# Patient Record
Sex: Female | Born: 1945 | Hispanic: No | Marital: Married | Smoking: Never smoker
Health system: Southern US, Community
[De-identification: ages and names within clinical notes are randomized; demographics above are authoritative.]

## PROBLEM LIST (undated history)

## (undated) DIAGNOSIS — F32A Depression, unspecified: Secondary | ICD-10-CM

## (undated) DIAGNOSIS — R112 Nausea with vomiting, unspecified: Secondary | ICD-10-CM

## (undated) DIAGNOSIS — I10 Essential (primary) hypertension: Secondary | ICD-10-CM

## (undated) DIAGNOSIS — K219 Gastro-esophageal reflux disease without esophagitis: Secondary | ICD-10-CM

## (undated) DIAGNOSIS — Z9889 Other specified postprocedural states: Secondary | ICD-10-CM

## (undated) DIAGNOSIS — E785 Hyperlipidemia, unspecified: Secondary | ICD-10-CM

## (undated) HISTORY — DX: Other specified postprocedural states: Z98.890

## (undated) HISTORY — DX: Depression, unspecified: F32.A

## (undated) HISTORY — DX: Hyperlipidemia, unspecified: E78.5

## (undated) HISTORY — DX: Essential (primary) hypertension: I10

## (undated) HISTORY — DX: Gastro-esophageal reflux disease without esophagitis: K21.9

---

## 1997-11-23 HISTORY — PX: FACELIFT, BLEPHAROPLASTY, LASER (COSMETIC): SHX4099

## 2001-11-23 DIAGNOSIS — C679 Malignant neoplasm of bladder, unspecified: Secondary | ICD-10-CM

## 2001-11-23 HISTORY — DX: Malignant neoplasm of bladder, unspecified: C67.9

## 2001-11-23 HISTORY — PX: BREAST SURGERY: SHX581

## 2003-06-08 ENCOUNTER — Ambulatory Visit: Admission: RE | Admit: 2003-06-08 | Payer: Self-pay | Source: Ambulatory Visit | Admitting: Gastroenterology

## 2006-06-11 ENCOUNTER — Ambulatory Visit: Admission: RE | Admit: 2006-06-11 | Payer: Self-pay | Source: Ambulatory Visit | Admitting: Gastroenterology

## 2007-01-23 ENCOUNTER — Emergency Department: Admit: 2007-01-23 | Payer: Self-pay | Source: Emergency Department | Admitting: Emergency Medicine

## 2010-11-23 HISTORY — PX: COSMETIC SURGERY: SHX468

## 2011-11-10 NOTE — Op Note (Signed)
MRN: [id number] DOCUMENT ID: [id number]      INTRODUCTION:      65 YEAR OLD FEMALE PATIENT PRESENTS FOR AN ELECTIVE OUTPATIENT EGD.  THE      INDICATION FOR THE PROCEDURE WAS GERD SYMPTOMS.      CONSENT:      THE BENEFITS, RISKS, AND ALTERNATIVES TO THE PROCEDURE WERE DISCUSSED AND      INFORMED CONSENT WAS OBTAINED.      PREPARATION:      EKG, PULSE, PULSE OXIMETRY AND BLOOD PRESSURE MONITORED.      MEDICATIONS:      - MAC ANESTHESIA WAS ADMINISTERED THROUGHOUT THE PROCEDURE      PROCEDURE:      THE ENDOSCOPE WAS PASSED WITHOUT DIFFICULTY UNDER DIRECT VISUALIZATION TO      THE 3RD PORTION OF THE DUODENUM.  RETROFLEXION WAS PERFORMED IN THE FUNDUS.      FINDINGS:      ESOPHAGUS: POSSIBLE BARRETT'S ESOPHAGUS WAS NOTED 2 CM ABOVE THE GE      JUNCTION.  MULTIPLE COLD BIOPSIES WERE OBTAINED FROM THE ESOPHAGUS.  THE      ESOPHAGUS WAS OTHERWISE NORMAL.      DUODENUM: THE DUODENUM APPEARED NORMAL.      STOMACH: THE STOMACH APPEARED NORMAL.      COMPLICATIONS:      THERE WERE NO COMPLICATIONS ASSOCIATED WITH THE PROCEDURE.      IMPRESSION:      1.  POSSIBLE BARRETT'S ESOPHAGUS. [530.2]. MULTIPLE COLD BIOPSIES WERE      OBTAINED FROM THE ESOPHAGUS.      2.  THE DUODENUM APPEARED NORMAL.      3.  THE STOMACH APPEARED NORMAL.      RECOMMENDATION:      - CONTINUE CURRENT MEDICATIONS.      - FOLLOW-UP WITH DR. Wardell Heath IN 2 WEEKS.      SIGNING PHYSICIAN: Marnie Fazzino

## 2012-09-03 ENCOUNTER — Ambulatory Visit (INDEPENDENT_AMBULATORY_CARE_PROVIDER_SITE_OTHER): Payer: Medicare Other | Admitting: Adult Health

## 2012-09-03 ENCOUNTER — Encounter (INDEPENDENT_AMBULATORY_CARE_PROVIDER_SITE_OTHER): Payer: Self-pay

## 2012-09-03 VITALS — BP 132/84 | HR 84 | Temp 98.2°F | Resp 18 | Ht 64.0 in | Wt 141.6 lb

## 2012-09-03 DIAGNOSIS — L0390 Cellulitis, unspecified: Secondary | ICD-10-CM

## 2012-09-03 DIAGNOSIS — L0291 Cutaneous abscess, unspecified: Secondary | ICD-10-CM

## 2012-09-03 MED ORDER — DOXYCYCLINE HYCLATE 100 MG PO TABS
100.00 mg | ORAL_TABLET | Freq: Two times a day (BID) | ORAL | Status: AC
Start: 2012-09-03 — End: 2012-09-13

## 2012-09-03 NOTE — Patient Instructions (Addendum)
Cellulitis  You have an infection of the skin known as cellulitis. This usually starts with a scrape, cut, insect bite, blister or other opening in the skin which becomes infected. This is a serious condition. It must be watched closely to be sure the infection is not spreading.  With antibiotic treatment, the size of the red area will gradually shrink in size until the skin returns to normal. This will take 7-10 days.  The red area should never increase in size once the antibiotic medicine has been started. Occasionally, an infection will be resistant to one antibiotic and another one will have to be used.  Home Care:  1) Limit the use of the affected part, since excess movement can cause the infection to spread.  2) If the infection is on your leg, walk as little as possible during the first few days of the treatment. Keep your leg elevated while sitting. This will reduce swelling.  3) Take all of the antibiotic medicine exactly as directed until it is gone. Be careful not to miss any doses, especially during the first seven days.  Follow Up  with your doctor or this facility as directed. Check the infected area daily for the warning signs listed below.  Get Prompt Medical Attention  if any of the following occur:  -- Spreading area of redness  -- Increasing swelling or pain  -- Appearance of pus or drainage  -- Fever over 100.4 F (38.0 C) oral, or over 101.4 F (38.6 C) rectal, after two days on antibiotics   2000-2013 [employer], [address], [place] [address]. All rights reserved. This information is not intended as a substitute for professional medical care. Always follow your healthcare professional's instructions.

## 2012-09-04 NOTE — Progress Notes (Signed)
Subjective:       Patient ID: Natalie Dean is a 66 y.o. female.  Chief Complaint   Patient presents with   . Cellulitis     Pt presents with an infection behind her left ear.  Pt states that the infection begin with the removal of a tick on 08/22/2012.  Pt was placed on tetracycline and neosporin when removal occured by pcp   Had plastic surgery x 1 year ago, sutures done behind the ear     Rash  This is a recurrent problem. The current episode started in the past 7 days. The affected locations include the face (behind lt ear ). The rash is characterized by pain, redness and swelling. She was exposed to nothing (though she pulled a tick from behind her ear ).       The following portions of the patient's history were reviewed and updated as appropriate: allergies, current medications, past family history, past medical history, past social history, past surgical history and problem list.    Review of Systems   Skin: Positive for rash (behind lt ear ).           Objective:    Physical Exam   Constitutional: She appears well-developed and well-nourished.   HENT:   Head: Normocephalic.   Eyes: Pupils are equal, round, and reactive to light.   Cardiovascular: Normal rate and regular rhythm.    Pulmonary/Chest: Effort normal.   Skin: Skin is warm and dry. Lesion noted.                  Assessment:       1. Cellulitis            Plan:        Medicines as prescribed .    Follow-up w/ PMD

## 2015-02-10 ENCOUNTER — Ambulatory Visit (INDEPENDENT_AMBULATORY_CARE_PROVIDER_SITE_OTHER): Payer: Medicare Other | Admitting: Adult Health

## 2015-02-10 ENCOUNTER — Encounter (INDEPENDENT_AMBULATORY_CARE_PROVIDER_SITE_OTHER): Payer: Self-pay

## 2015-02-10 VITALS — BP 135/76 | HR 88 | Temp 99.9°F | Resp 16 | Ht 64.0 in | Wt 141.0 lb

## 2015-02-10 DIAGNOSIS — Z029 Encounter for administrative examinations, unspecified: Secondary | ICD-10-CM

## 2015-02-10 DIAGNOSIS — R6889 Other general symptoms and signs: Secondary | ICD-10-CM

## 2015-02-10 DIAGNOSIS — J101 Influenza due to other identified influenza virus with other respiratory manifestations: Secondary | ICD-10-CM

## 2015-02-10 LAB — POCT INFLUENZA A/B
POCT Rapid Influenza A AG: POSITIVE — AB
POCT Rapid Influenza B AG: NEGATIVE

## 2015-02-10 MED ORDER — ALBUTEROL SULFATE HFA 108 (90 BASE) MCG/ACT IN AERS
2.0000 | INHALATION_SPRAY | Freq: Four times a day (QID) | RESPIRATORY_TRACT | Status: AC | PRN
Start: 2015-02-10 — End: 2016-02-10

## 2015-02-10 MED ORDER — OSELTAMIVIR PHOSPHATE 75 MG PO CAPS
75.0000 mg | ORAL_CAPSULE | Freq: Two times a day (BID) | ORAL | Status: AC
Start: 2015-02-10 — End: 2015-02-15

## 2015-02-10 MED ORDER — ALBUTEROL-IPRATROPIUM 2.5-0.5 (3) MG/3ML IN SOLN
3.0000 mL | Freq: Once | RESPIRATORY_TRACT | Status: AC
Start: 2015-02-10 — End: 2015-02-10
  Administered 2015-02-10: 3 mL via RESPIRATORY_TRACT

## 2015-02-10 MED ORDER — HYDROCODONE-HOMATROPINE 5-1.5 MG/5ML PO SYRP
5.0000 mL | ORAL_SOLUTION | Freq: Four times a day (QID) | ORAL | Status: AC | PRN
Start: 2015-02-10 — End: 2015-02-20

## 2015-02-10 NOTE — Patient Instructions (Signed)
Influenza  Influenza ("the flu") is an infection that affects your respiratory tract (the mouth, nose, and lungs, and the passages between them). Unlike a cold, the flu can make you very ill. And it can lead to pneumonia, a serious lung infection. For some people, especially older adults, young children, and people with certain chronic conditions, the flu can have serious complications and even be fatal.  What Are the Risk Factors for the Flu?  Anyone can get the flu. But you're more likely to become infected if you:   Have a weakened immune system.   Work in a health care setting where you may be exposed to flu germs.   Live or work with someone who has the flu.   Haven't received an annual flu shot.  How Does the Flu Spread?  The flu is caused by viruses. The viruses spread through the air in droplets when someone who has the flu coughs, sneezes, laughs, or talks. You can become infected when you inhale theseviruses directly. You can also become infected when you touch a surface on which the droplets have landed and then transfer the germs to your eyes, nose, or mouth. Touching used tissues, or sharing utensils, drinking glasses, or a toothbrush with an infected person can expose you to flu viruses, too.  What Are the Symptoms of the Flu?  Flu symptoms tend to come on quickly and may last a few days to a few weeks. They include:   Fever usually higher than 101F (38.3C)and chills   Sore throat and headache   Dry cough   Runny nose   Tiredness and weakness   Muscle aches    How Is the Flu Treated?  Influenza usually improves after 7 days or so. In some cases, yourhealth care providermay prescribe an antiviral medication. This may help you get well sooner. For the medication to help, you need to take it as soon as possible (ideally within 48 hours)after your symptoms start. If you develop pneumonia or other serious illness, hospital care may be needed.  Easing Flu Symptoms   Drink lots of fluids  such as water, juice, and warm soup. A good rule is to drink enough so that you urinate your normal amount.   Get plenty of rest.   Ask yourhealth care providerwhat to takefor fever and pain.   Call yourprovider if your fever rises over 101F (38.3C) or you become dizzy, lightheaded, or short of breath.  TakingSteps to Protect Others   Wash your hands often, especially after coughing or sneezing. Or, clean your hands with an alcohol-based handcleaner containing at least 60 percent alcohol.   Cough or sneeze into a tissue. Then throw the tissue away and wash your hands. If you don't have a tissue, cough and sneeze into the crook of your elbow.   Stay home untilat least 24 hours after you no longer have a fever or chills. Be sure the fever isn't being hidden by fever-reducing medication.   Don't share food, utensils, drinking glasses, or a toothbrush with others.   Ask yourhealth care provider ifothers in your household should receive antiviral medication to help them avoid infection.  How Can the Flu Be Prevented?   One of the best ways to avoid the flu is to get a flu vaccination each year. Viruses that cause the flu change from year to year. For that reason, doctors recommend getting the flu vaccine each year, as soon as it's available in your area. The vaccine may be   given as a shot or as anasal spray. Yourhealth care providercan tell you which vaccine is right for you.   Wash your hands often. Frequent handwashing is a proven way to help prevent infection.   Carry an alcohol-based hand gel containing at least 60 percent alcohol. Use it when you don't have access to soap and water. Then wash your hands as soon as you can.   Avoid touching your eyes, nose, and mouth.   At home and work, clean phones, computer keyboards, and toys often with disinfectant wipes.   If possible, avoid close contact with others who have the flu or symptoms of the flu.  Handwashing Tips  Handwashing is one of  the best ways to prevent many common infections. If you're caring for or visiting someone with the flu, wash your hands each time you enter and leave the room. Follow these steps:   Use warm water and plenty of soap. Rub your hands together well.   Clean the whole hand, under your nails, between your fingers, and up the wrists.   Wash for at least 15seconds.   Rinse, letting the water run down your fingers, not up your wrists.   Dry your hands well. Use a paper towel to turn off the faucet and open the door.  Using Alcohol-Based Hand Cleaners  Alcohol-based handcleaners are also a good choice. Use them when you don't have access to soap and water. Follow these steps:   Squeeze about a tablespoon of gel into the palm of one hand.   Rub your hands together briskly, cleaning the backs of your hands, the palms, between your fingers, and up the wrists.   Rub until the gel is gone and your hands are completely dry.       2000-2015 [employer] [address], [place] [address]. All rights reserved. This information is not intended as a substitute for professional medical care. Always follow your healthcare professional's instructions.

## 2015-02-10 NOTE — Progress Notes (Signed)
Subjective:       Patient ID: Natalie Dean is a 69 y.o. female.  Chief Complaint   Patient presents with   . Flu like symptoms     sx started on Friday evening, "boom" started coughing and then yesterday felt "dreadful" tried some mucinex, coughing began to hurt, states she has bilateral ear pain, throat pain       Influenza  This is a new problem. The current episode started in the past 7 days. The problem occurs constantly. The problem has been rapidly worsening. Associated symptoms include chills, congestion, coughing, fatigue, a fever, headaches, myalgias, a sore throat and weakness. Nothing aggravates the symptoms. She has tried NSAIDs for the symptoms. The treatment provided no relief.       The following portions of the patient's history were reviewed and updated as appropriate: allergies, current medications, past family history, past medical history, past social history, past surgical history and problem list.    Review of Systems   Constitutional: Positive for fever, chills, activity change and fatigue.   HENT: Positive for congestion, postnasal drip, rhinorrhea, sinus pressure and sore throat.    Respiratory: Positive for cough and wheezing.    Musculoskeletal: Positive for myalgias.   Neurological: Positive for weakness and headaches.   All other systems reviewed and are negative.          Objective:     Physical Exam   Constitutional: Vital signs are normal. She appears well-developed and well-nourished. She is active. She appears ill. No distress.   HENT:   Head: Normocephalic and atraumatic.   Right Ear: Tympanic membrane is bulging.   Left Ear: Tympanic membrane is bulging.   Nose: Mucosal edema and rhinorrhea present.   Mouth/Throat: Posterior oropharyngeal edema and posterior oropharyngeal erythema present.   Eyes: Pupils are equal, round, and reactive to light.   Neck: Normal range of motion.   Cardiovascular: Normal rate, regular rhythm, S1 normal and S2 normal.    Pulmonary/Chest: Effort  normal. She has decreased breath sounds in the right lower field and the left lower field. She has wheezes in the right lower field and the left lower field.   Lymphadenopathy:        Head (right side): No submandibular, no tonsillar and no preauricular adenopathy present.        Head (left side): No submandibular, no tonsillar and no preauricular adenopathy present.   Neurological: She is alert.   Skin: Skin is warm, dry and intact.   Psychiatric: She has a normal mood and affect. Her speech is normal and behavior is normal. Judgment and thought content normal. Cognition and memory are normal.     Duo neb x1 -       Results     Procedure Component Value Units Date/Time    Influenza A/B [[id number]]  (Abnormal) Collected:  02/10/15 1106     POCT QC Pass Updated:  02/10/15 1106     POCT Rapid Influenza A AG Positive (A)      POCT Rapid Influenza B AG Negative           Assessment:       1. Flu-like symptoms  Influenza A/B    albuterol-ipratropium (DUO-NEB) 2.5-0.5(3) mg/3 mL nebulizer 3 mL   2. Influenza A  oseltamivir (TAMIFLU) 75 MG capsule    albuterol (PROVENTIL HFA;VENTOLIN HFA) 108 (90 BASE) MCG/ACT inhaler    HYDROcodone-homatropine (HYCODAN) 5-1.5 MG/5ML syrup  Plan:        Medicines as prescribed .    Follow-up w/ PMD   See AVS

## 2015-02-25 ENCOUNTER — Ambulatory Visit (INDEPENDENT_AMBULATORY_CARE_PROVIDER_SITE_OTHER): Payer: Medicare Other | Admitting: Family Medicine

## 2015-02-25 ENCOUNTER — Encounter (INDEPENDENT_AMBULATORY_CARE_PROVIDER_SITE_OTHER): Payer: Self-pay

## 2015-02-25 VITALS — BP 178/68 | HR 71 | Temp 98.0°F | Resp 16 | Ht 63.0 in | Wt 138.6 lb

## 2015-02-25 DIAGNOSIS — J209 Acute bronchitis, unspecified: Secondary | ICD-10-CM

## 2015-02-25 MED ORDER — AZITHROMYCIN 250 MG PO TABS
ORAL_TABLET | ORAL | Status: AC
Start: 2015-02-25 — End: 2015-03-02

## 2015-02-25 NOTE — Progress Notes (Signed)
Subjective:       Patient ID: Natalie Dean is a 69 y.o. female.    URI   This is a new problem. The current episode started 1 to 4 weeks ago. The problem has been gradually worsening. There has been no fever. Associated symptoms include congestion and coughing. Pertinent negatives include no abdominal pain, chest pain, diarrhea, dysuria, ear pain, headaches, nausea, rash, rhinorrhea, sinus pain, sneezing, sore throat, swollen glands, vomiting or wheezing. She has tried nothing for the symptoms. The treatment provided no relief.           Review of Systems   Constitutional: Positive for chills.        Subjective fever     HENT: Positive for congestion and sinus pressure. Negative for ear pain, rhinorrhea, sneezing and sore throat.    Eyes: Negative for discharge and visual disturbance.   Respiratory: Positive for cough. Negative for apnea, choking, chest tightness, wheezing and stridor.    Cardiovascular: Negative for chest pain and palpitations.   Gastrointestinal: Negative for nausea, vomiting, abdominal pain and diarrhea.   Genitourinary: Negative for dysuria.   Musculoskeletal: Negative for myalgias and arthralgias.   Skin: Negative for rash.   Neurological: Negative for dizziness, weakness and headaches.   Hematological: Does not bruise/bleed easily.   Psychiatric/Behavioral: The patient is not nervous/anxious.    All other systems reviewed and are negative.          Objective:     Physical Exam   Nursing note and vitals reviewed.  Constitutional: She is oriented to person, place, and time. She appears well-developed and well-nourished. No distress.   HENT:   Head: Normocephalic and atraumatic.   Mouth/Throat: Oropharynx is clear and moist. No oropharyngeal exudate.   Eyes: Conjunctivae and EOM are normal. Pupils are equal, round, and reactive to light.   Neck: Normal range of motion. Neck supple.   Cardiovascular: Regular rhythm and normal heart sounds.  Exam reveals no gallop.    No murmur  heard.  Pulmonary/Chest: Effort normal. No respiratory distress. She has wheezes. She has no rales. She exhibits no tenderness.     BILAT BS; Mild wheeze; clears with cough   Musculoskeletal: Normal range of motion.   Lymphadenopathy:     She has no cervical adenopathy.   Neurological: She is alert and oriented to person, place, and time. She has normal reflexes. She displays normal reflexes. No cranial nerve deficit. She exhibits normal muscle tone. Coordination normal.     CN II - XII intact   Skin: Skin is warm. No rash noted. She is not diaphoretic. No erythema. No pallor.   Psychiatric: She has a normal mood and affect. Her behavior is normal. Judgment and thought content normal.           Assessment:     Recent URI / Influenza complicated with Acute bronchitis       Plan:       ZPACK    MUCINEX DM PRN    Rest, fluids, diet  PROBIOTICS    FOLLOW UP AS NEEDED  Patient to follow up with primary care doctor or follow up here if symptoms worsening or not resolving as expected.   Patient verbalized understanding.   Reviewed discharge instructions that are included here with patient, and printed in AVS. Questions answered.

## 2015-02-25 NOTE — Patient Instructions (Signed)
REST ACTIVELY, PLENTY FLUIDS, HEALTHY DIET    MUCINEX   - DM  AS NEEDED    STEAM   /    Ella Jubilee

## 2015-04-16 ENCOUNTER — Other Ambulatory Visit (INDEPENDENT_AMBULATORY_CARE_PROVIDER_SITE_OTHER): Payer: Self-pay

## 2017-04-06 ENCOUNTER — Ambulatory Visit (INDEPENDENT_AMBULATORY_CARE_PROVIDER_SITE_OTHER): Payer: BC Managed Care – PPO | Admitting: Neurology

## 2017-04-14 ENCOUNTER — Encounter (INDEPENDENT_AMBULATORY_CARE_PROVIDER_SITE_OTHER): Payer: Self-pay | Admitting: Neurology

## 2017-04-14 ENCOUNTER — Ambulatory Visit (INDEPENDENT_AMBULATORY_CARE_PROVIDER_SITE_OTHER): Payer: Medicare Other | Admitting: Neurology

## 2017-04-14 VITALS — BP 156/89 | HR 75

## 2017-04-14 DIAGNOSIS — R270 Ataxia, unspecified: Secondary | ICD-10-CM | POA: Insufficient documentation

## 2017-04-14 DIAGNOSIS — R4189 Other symptoms and signs involving cognitive functions and awareness: Secondary | ICD-10-CM

## 2017-04-14 NOTE — Progress Notes (Signed)
[email or web address]    Subjective:      71 y/o F with hx depression, HTLD, HTN, presents to Movement Disorders clinic for evaluation of memory changes.    During the last year of her mother's life, she began drinking 'too much'.  In March of this year, cut back significantly, now 1-2 glasses of wine a week. This seemed to stop the acting out dreams, which has been present about the past year.  Her husband say she is screaming, yelling, but her husband will wake her up. She has aggressive and terrible dreams.     Also, she has been on antidepressants for the past 20 years.  She has also noticed memory problems over the last 20 years but worse lately. She has difficulty recalling words, names and spelling. No getting lost, no forgetting people, no hallucinations, no wandering or getting lost.  Spatial still are waning.     She also reports changes in impulse control over the last time period, spending money in ways that don't make sense to her. That happened in February. No other lapses to that regard.    No focal motor or sensory loss, no change to sense of smell.  No fine motor use changes in the hands. No constipation. 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    For depression, remains on cymbalta and wellbutrin.     Her mother carried a diagnosis of Lewy Body Dementia. Her mother came from a big family and half developed dementia.  No head injury recently, no change of medication or injury or illness recently.     The following portions of the patient's history were reviewed and updated as appropriate: allergies, past family history, past medical history, past social history, past surgical history and problem list.    Review of Systems  + memory changes  No recent illness. Denies fever, chills, cough, sinus pain, eye pain, eye redness, ear pain, rhinorrhea, sore throat, chest pain, SOB, wheezing, abd pain, Nausea, Vomiting, diarrhea, constipation, dysuria, or rashes.  All other systems reviewed  and are negative except as previously noted in the HPI.    Objective:     Vitals:    04/14/17 1326   BP: 156/89   Pulse: 75     Constitutional: NAD   Eyes: Clear conjunctiva  Cardiovascular: RRR  Respiratory: CTA B   Musculoskeletal: Normal posture and muscle bulk.  Integumentary: No abnormal rash noted  Psych: Normal affect    Neurological:   Mental Status: Alert & oriented to person, place, month, & year.   Language: Spontaneous & fluent speech, good comprehension.    Cranial Nerves:  II, III: PERRL  III, IV, VI: EOMI, No nystagmus, no palsies, no ptosis  V: Intact to LT V1-V3 distribution bilaterally.   VI: Symmetrical face and expression.   VIII: Hearing intact to finger rub bilaterally.   IX, X: Palate/Uvula elevates symmetrically.   XI: 5/5 Trapezius & SCM bilaterally.   XII: Tongue is midline.     Motor Exam: Normal bulk and tone. No clonus. No pronator drift. Strength 5/5 throughout and symmetric.    Sensation: Sensation to LT intact grossly through symmetrically. Romberg negative    Cerebellar:   Finger to Nose: intact bilaterally    Reflexes: 2+ throughout, symmetric, with down-going toes.    Station/ Gait: Well balanced and stable. Normal foot width. Unable to tandem. Normal arm swing.    Normal tone, no tremor. Fine motor use of hands slowed but symmetric. No asterixis.  Assessment:     71 y/o F with hx depression, HTLD, HTN, presents to Movement Disorders clinic for evaluation of memory changes.  Her exam is nonfocal as above with no signs of parkinsonism, though she does have some tandem gait and balance issues, likely attributable to the degree of EtOH intake she describes.  Her impulsivity and memory issues are mild, and do not fit with the diagnosis of a cortical dementia, so I do not believe this to be an early case of lewy body dementia as she is concerned.  That being said, she is rightfully concerned so I will send her for formal neuropsychological testing to better quantify what areas of  memory, focus and attention are being affected, and also services a baseline for the future.    We'll reconvene in 6 weeks to review the results. In the interim, I discussed reduction or abstention from alcohol as it is starting to affect her cerebellar function.    Plan:     As above.     RTC in 1.5-2 mo. Patient can follow up sooner if needed. In the meantime, patient will contact the office with any questions or concerns.     -----------------------------------------------------------    Additional notes and data scanned including patient questionnaire which may contain pertinent information to visit.     More than 50% of this 60 min office visit was spent counseling the patient on:   Pt's specific disease state including discussion about the medical condition, diagnostic and treatment options, as well as the plan as above.  We also discussed medication options, what pt is taking, indications and side effects. Finally we discussed specific lifestyle issues related to pt's condition including need for exercise and other lifestyle modifications.      Crystallee Werden "Johnston Ebbs, MD  Co-Director  Movement Disorders Specialist  [facility]  [facility]    [address]  [address]  [place], [address]    [place], [place] [address]  T [phone]  F [phone]   T [phone]  F [phone]     [email or web address]

## 2017-04-14 NOTE — Patient Instructions (Signed)
[email or web address]    Our plan:     1) Please call and schedule formal neuropsychological testing.    - Please call [facility] to schedule.   Their contact info is:  [phone]  [address], [address]  [place], [place] [address]    Return to clinic in the beginning of August.    Today's Visit:      In today's visit I reviewed your medications and records relating your health - prior testing, blood work, reports of other health care providers present in your electronic medical record.     If you have records from non-[facility] doctors please send to [place] or bring to next office visit.     A copy of today's visit will be sent to your referring doctor and/or primary care doctor.    Let me know if there are things we could have done better for your office visit.    Patient satisfaction survey:      If you receive a patient satisfaction survey, I would greatly appreciate it if you would complete it.     We are like a car dealer where we aim for a score of 9 or 10.  If you had an experience that was less than a 9 or 10, please let me know so we can improve. If you had a good experience today, please let us know too.  We value your feedback.     Contact me online:      Patient Portal online - Please sign up for MyChart -- this is the best way to communicate me.  There is a messaging feature which can send messages directly to my inbox.  It is the best way to communicate with me and get test results, medication refills or ask questions.     You can expect to get a response within 24-48 hours during weekdays - if you do not, resend your request and inform us we did not respond. My goal is for every question answered as soon as possible. Average response for a phone call is 3 days due to the volume we receive (which is why MyChart is preferred).    MyChart should not be used if you are having a medical emergency -- call 911 in that case.     Coupons for medication:      If you  would like to see if samples or vouchers available for your medicines please consider checking online.  Most medicines have web sites where you can print coupons or vouchers for you co-pay.     For example: AutomobileBuzz.is,  DSLRemote.se, Namendaxr.com, http://www.walker-hill.info/, Rytary.[email or web address], Vimpat.com    Thank you for trusting me with your health.      Take care,      Yarianna Varble "Johnston Ebbs, MD  Co-Director  Movement Disorders Specialist  [facility]  [facility]    [email or web address]      [address]  [address]  [place] [address]    [place], [place] [address]  T [phone]  F [phone]   T [phone]  F [phone]

## 2017-07-05 ENCOUNTER — Ambulatory Visit (INDEPENDENT_AMBULATORY_CARE_PROVIDER_SITE_OTHER): Payer: BC Managed Care – HMO | Admitting: Neurology

## 2017-07-08 ENCOUNTER — Ambulatory Visit (INDEPENDENT_AMBULATORY_CARE_PROVIDER_SITE_OTHER): Payer: Medicare Other | Admitting: Neurology

## 2017-07-30 ENCOUNTER — Encounter (INDEPENDENT_AMBULATORY_CARE_PROVIDER_SITE_OTHER): Payer: Self-pay | Admitting: Neurology

## 2017-07-30 ENCOUNTER — Other Ambulatory Visit (INDEPENDENT_AMBULATORY_CARE_PROVIDER_SITE_OTHER): Payer: Self-pay | Admitting: Neurology

## 2017-07-30 DIAGNOSIS — R4189 Other symptoms and signs involving cognitive functions and awareness: Secondary | ICD-10-CM

## 2017-08-03 ENCOUNTER — Ambulatory Visit (INDEPENDENT_AMBULATORY_CARE_PROVIDER_SITE_OTHER): Payer: Medicare Other | Admitting: Vascular Neurology

## 2017-08-03 DIAGNOSIS — R4189 Other symptoms and signs involving cognitive functions and awareness: Secondary | ICD-10-CM

## 2017-08-04 NOTE — Progress Notes (Signed)
Tech Report   EEG No: MR#  [id number]  Hrs of sleep: 6  Dominance: right   Last Meal: am  Caffeine:  No  Previous EEG: No  Ms. Mcmath has a current medication list which includes the following prescription(s): aspirin ec, atorvastatin, bupropion xl, calcium carb-cholecalciferol, duloxetine, esomeprazole magnesium, lisinopril, lisinopril, montelukast, and valsartan.   History: Depression, TN, memory changes, Ataxia  Description: PS, Awake, Drowsy, Asleep and Alert  Activity Noted:  9 Hz background, ALVF anteriorly, limited EKG rhythm strip using 2 electrodes show a regular rhythm, intermittent photic stimulation produced bilateral driving responses, generalized slow with drowsy, sleep was achieved with background slow and spindles  See Times: 0 Technologist: Oliva Bustard charges

## 2017-08-19 ENCOUNTER — Encounter (INDEPENDENT_AMBULATORY_CARE_PROVIDER_SITE_OTHER): Payer: Self-pay | Admitting: Neurology

## 2017-08-20 NOTE — Procedures (Signed)
Electroencephalogram Report    Date(s) of review: 08/03/2017   Patient Name:  Natalie Dean, Natalie Dean  DOB:  [birth year]  Sex:  female  MRN: [id number]    Indication: ataxia, cognitive changes    Summary of findings:    Background tracing consisted of 50-70 V activity in the 8-9 Hz alpha range, which attenuated with eye-opening.  Drowsiness was seen with mild slowing of the background.  Stage II sleep was briefly seen.  Intermittent photic stimulation was performed with a bilateral driving response noted.  Hyperventilation was not performed presumably secondary to the patient's medical condition.  Typical myogenic and electrode artifacts were seen at times, contributing to sharp contours on the background.  Other sharp transients were seen at times, but no definite discharges or seizure activity seen.  EKG showed a regular rhythm.    Interpretation/Impression:    This was an overall unremarkable waking, drowsy and light sleep recording.  Typical artifacts seen as described above, but no definite discharges or seizures noted.  Correlation recommended.            Gabi Mcfate B. Stacy Gardner, MD  [facility]  Board Certified in Neurology, Clinical Neurophysiology, Sleep Medicine

## 2017-08-23 ENCOUNTER — Ambulatory Visit (INDEPENDENT_AMBULATORY_CARE_PROVIDER_SITE_OTHER): Payer: Self-pay | Admitting: Cardiovascular Disease

## 2017-08-23 LAB — VAHRT HISTORIC LVEF: Ejection Fraction: 55 %

## 2017-08-25 ENCOUNTER — Ambulatory Visit (INDEPENDENT_AMBULATORY_CARE_PROVIDER_SITE_OTHER): Payer: Medicare Other | Admitting: Neurology

## 2017-08-25 ENCOUNTER — Encounter (INDEPENDENT_AMBULATORY_CARE_PROVIDER_SITE_OTHER): Payer: Self-pay | Admitting: Neurology

## 2017-08-25 VITALS — BP 148/86 | HR 92

## 2017-08-25 DIAGNOSIS — G3184 Mild cognitive impairment, so stated: Secondary | ICD-10-CM

## 2017-08-25 DIAGNOSIS — R4189 Other symptoms and signs involving cognitive functions and awareness: Secondary | ICD-10-CM

## 2017-08-25 DIAGNOSIS — R270 Ataxia, unspecified: Secondary | ICD-10-CM

## 2017-08-25 MED ORDER — DONEPEZIL HCL 5 MG PO TABS
5.0000 mg | ORAL_TABLET | Freq: Every evening | ORAL | 2 refills | Status: DC
Start: 2017-08-25 — End: 2017-11-02

## 2017-08-25 NOTE — Progress Notes (Signed)
[email or web address]    Subjective:      71 y/o F with hx depression, HTLD, HTN, presents to Movement Disorders clinic for f/u of memory changes.    Memory is about the same, hasn't increased or changed. No more episodes of being out of it. She continues to battle significant depression.    EEG completed, reviewed:    Summary of findings:    Background tracing consisted of 50-70 V activity in the 8-9 Hz alpha range, which attenuated with eye-opening.  Drowsiness was seen with mild slowing of the background.  Stage II sleep was briefly seen.  Intermittent photic stimulation was performed with a bilateral driving response noted.  Hyperventilation was not performed presumably secondary to the patient's medical condition.  Typical myogenic and electrode artifacts were seen at times, contributing to sharp contours on the background.  Other sharp transients were seen at times, but no definite discharges or seizure activity seen.  EKG showed a regular rhythm.    Interpretation/Impression:    This was an overall unremarkable waking, drowsy and light sleep recording.  Typical artifacts seen as described above, but no definite discharges or seizures noted.  Correlation recommended.    Formal Neuropsychological testing completed, 06/2017:  - Overall, although the patient did not demonstrate global memory impairment, she exhibited difficulty on 2 of 3 measures of learning and memory.  This suggests at least some degree of memory decline.  - Assuming that organic/neurologic causes of cognitive decline can be ruled out, the possibility of mild cognitive impairment in cortical dementia were considered.  From a cognitive perspective, performance was somewhat worse than expected in people with mild cognitive impairment.  - However, she has a family history of Lewy body dementia and she demonstrated the variability in psychomotor speed, and attention often observed in people with this condition.    - The  patient psychiatric distress is not primary with respect to the cause of her cognitive difficulties.  However, depression, anxiety, clearly are affecting her quality of life.    -------------------------------  History retained from initial consultation:    During the last year of her mother's life, she began drinking 'too much'.  In March of this year, cut back significantly, now 1-2 glasses of wine a week. This seemed to stop the acting out dreams, which has been present about the past year.  Her husband say she is screaming, yelling, but her husband will wake her up. She has aggressive and terrible dreams.     Also, she has been on antidepressants for the past 20 years.  She has also noticed memory problems over the last 20 years but worse lately. She has difficulty recalling words, names and spelling. No getting lost, no forgetting people, no hallucinations, no wandering or getting lost.  Spatial still are waning.     She also reports changes in impulse control over the last time period, spending money in ways that don't make sense to her. That happened in February. No other lapses to that regard.    No focal motor or sensory loss, no change to sense of smell.  No fine motor use changes in the hands. No constipation. 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    For depression, remains on cymbalta and wellbutrin.     Her mother carried a diagnosis of Lewy Body Dementia. Her mother came from a big family and half developed dementia.  No head injury recently, no change of medication or injury or illness recently.  The following portions of the patient's history were reviewed and updated as appropriate: allergies, past family history, past medical history, past social history, past surgical history and problem list.    Review of Systems  + memory changes  No recent illness. Denies fever, chills, cough, sinus pain, eye pain, eye redness, ear pain, rhinorrhea, sore throat, chest pain, SOB, wheezing, abd pain, Nausea,  Vomiting, diarrhea, constipation, dysuria, or rashes.  All other systems reviewed and are negative except as previously noted in the HPI.    Objective:     Vitals:    08/25/17 1113   BP: 148/86   Pulse: 92     Constitutional: NAD   Eyes: Clear conjunctiva  Cardiovascular: RRR  Respiratory: CTA B   Musculoskeletal: Normal posture and muscle bulk.  Integumentary: No abnormal rash noted  Psych: Normal affect    Neurological:   Mental Status: Alert & oriented to person, place, month, & year.   Language: Spontaneous & fluent speech, good comprehension.    Cranial Nerves:  II, III: PERRL  III, IV, VI: EOMI, No nystagmus, no palsies, no ptosis  V: Intact to LT V1-V3 distribution bilaterally.   VI: Symmetrical face and expression.   VIII: Hearing intact to finger rub bilaterally.   IX, X: Palate/Uvula elevates symmetrically.   XI: 5/5 Trapezius & SCM bilaterally.   XII: Tongue is midline.     Motor Exam: Normal bulk and tone. No clonus. No pronator drift. Strength 5/5 throughout and symmetric.    Sensation: Sensation to LT intact grossly through symmetrically. Romberg negative    Cerebellar:   Finger to Nose: intact bilaterally    Reflexes: 2+ throughout, symmetric, with down-going toes.    Station/ Gait: Well balanced and stable. Normal foot width. Unable to tandem. Normal arm swing.    Normal tone, no tremor. Fine motor use of hands slowed but symmetric. No asterixis.     Assessment:     71 y/o F with hx depression, HTLD, HTN, presents to Movement Disorders clinic for f/u of memory changes.  Her exam is nonfocal as above with no signs of parkinsonism, though she does have some tandem gait and balance issues, likely attributable to the degree of EtOH intake she describes in the past, now abstinent.  Her impulsivity and memory issues are mild, and do not fit with the diagnosis of a cortical dementia, so I do not believe this to be an early case of lewy body dementia as she is concerned.      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  This could be multifactorial in origin, but bothersome to her.  To counter, we'll begin donepezil 5mg  daily to try to help. Discussed dosing, treatment expectations and s/e of which to be aware. Answered all questions regarding this medication.    Plan:     As above.     RTC in 3-5 mo. Patient can follow up sooner if needed. In the meantime, patient will contact the office with any questions or concerns.     -----------------------------------------------------------    Additional notes and data scanned including patient questionnaire which may contain pertinent information to visit.     More than 50% of this 25 min office visit was spent counseling the patient on:   Pt's specific disease state including discussion about the medical condition, diagnostic and treatment options, as well as the plan as above.  We also discussed medication options, what pt is taking, indications  and side effects. Finally we discussed specific lifestyle issues related to pt's condition including need for exercise and other lifestyle modifications.      Fishel Wamble "Johnston Ebbs, MD  Co-Director  Movement Disorders Specialist  [facility]  [facility]    [address]  [address]  [place] [address]    [place], [place] [address]  T [phone]  F [phone]   T [phone]  F [phone]     [email or web address]

## 2017-08-26 ENCOUNTER — Encounter (INDEPENDENT_AMBULATORY_CARE_PROVIDER_SITE_OTHER): Payer: Self-pay

## 2017-08-30 ENCOUNTER — Encounter (INDEPENDENT_AMBULATORY_CARE_PROVIDER_SITE_OTHER): Payer: Self-pay | Admitting: Neurology

## 2017-08-31 ENCOUNTER — Encounter (INDEPENDENT_AMBULATORY_CARE_PROVIDER_SITE_OTHER): Payer: Self-pay | Admitting: Neurology

## 2017-09-01 ENCOUNTER — Ambulatory Visit (INDEPENDENT_AMBULATORY_CARE_PROVIDER_SITE_OTHER): Payer: Self-pay

## 2017-09-02 ENCOUNTER — Ambulatory Visit (INDEPENDENT_AMBULATORY_CARE_PROVIDER_SITE_OTHER): Payer: Self-pay

## 2017-09-23 HISTORY — PX: OTHER SURGICAL HISTORY: SHX169

## 2017-10-29 ENCOUNTER — Encounter (INDEPENDENT_AMBULATORY_CARE_PROVIDER_SITE_OTHER): Payer: Self-pay | Admitting: Neurology

## 2017-11-02 ENCOUNTER — Other Ambulatory Visit (INDEPENDENT_AMBULATORY_CARE_PROVIDER_SITE_OTHER): Payer: Self-pay | Admitting: Neurology

## 2017-11-02 MED ORDER — DONEPEZIL HCL 5 MG PO TABS
5.0000 mg | ORAL_TABLET | Freq: Every evening | ORAL | 3 refills | Status: AC
Start: 2017-11-02 — End: 2018-11-02

## 2017-12-04 ENCOUNTER — Other Ambulatory Visit: Payer: Self-pay | Admitting: Internal Medicine

## 2018-02-03 ENCOUNTER — Ambulatory Visit: Payer: Medicare Other | Attending: Gastroenterology

## 2018-02-03 ENCOUNTER — Ambulatory Visit (INDEPENDENT_AMBULATORY_CARE_PROVIDER_SITE_OTHER): Payer: Self-pay

## 2018-02-03 ENCOUNTER — Other Ambulatory Visit (INDEPENDENT_AMBULATORY_CARE_PROVIDER_SITE_OTHER): Payer: Self-pay | Admitting: Internal Medicine

## 2018-02-03 DIAGNOSIS — M79642 Pain in left hand: Secondary | ICD-10-CM

## 2018-02-03 NOTE — Pre-Procedure Instructions (Signed)
No orders/testing noted.

## 2018-02-28 ENCOUNTER — Other Ambulatory Visit: Payer: Self-pay | Admitting: Neurology

## 2018-03-04 ENCOUNTER — Ambulatory Visit: Payer: Self-pay

## 2018-03-04 ENCOUNTER — Encounter: Payer: Self-pay | Admitting: Pain Medicine

## 2018-03-04 ENCOUNTER — Ambulatory Visit: Payer: Medicare Other | Admitting: Pain Medicine

## 2018-03-04 ENCOUNTER — Encounter
Admission: RE | Disposition: A | Discharge status: Home / Self Care | Payer: Self-pay | Source: Ambulatory Visit | Attending: Gastroenterology

## 2018-03-04 ENCOUNTER — Ambulatory Visit
Admission: RE | Admit: 2018-03-04 | Discharge: 2018-03-04 | Disposition: A | Discharge status: Home / Self Care | Payer: Medicare Other | Source: Ambulatory Visit | Attending: Gastroenterology | Admitting: Gastroenterology

## 2018-03-04 DIAGNOSIS — Z8551 Personal history of malignant neoplasm of bladder: Secondary | ICD-10-CM | POA: Insufficient documentation

## 2018-03-04 DIAGNOSIS — Z853 Personal history of malignant neoplasm of breast: Secondary | ICD-10-CM | POA: Insufficient documentation

## 2018-03-04 DIAGNOSIS — K449 Diaphragmatic hernia without obstruction or gangrene: Secondary | ICD-10-CM | POA: Insufficient documentation

## 2018-03-04 DIAGNOSIS — Z882 Allergy status to sulfonamides status: Secondary | ICD-10-CM | POA: Insufficient documentation

## 2018-03-04 DIAGNOSIS — K219 Gastro-esophageal reflux disease without esophagitis: Secondary | ICD-10-CM

## 2018-03-04 DIAGNOSIS — K317 Polyp of stomach and duodenum: Secondary | ICD-10-CM | POA: Insufficient documentation

## 2018-03-04 DIAGNOSIS — Z880 Allergy status to penicillin: Secondary | ICD-10-CM | POA: Insufficient documentation

## 2018-03-04 DIAGNOSIS — I10 Essential (primary) hypertension: Secondary | ICD-10-CM | POA: Insufficient documentation

## 2018-03-04 DIAGNOSIS — K2950 Unspecified chronic gastritis without bleeding: Secondary | ICD-10-CM | POA: Insufficient documentation

## 2018-03-04 DIAGNOSIS — E7800 Pure hypercholesterolemia, unspecified: Secondary | ICD-10-CM | POA: Insufficient documentation

## 2018-03-04 DIAGNOSIS — K319 Disease of stomach and duodenum, unspecified: Secondary | ICD-10-CM

## 2018-03-04 DIAGNOSIS — K210 Gastro-esophageal reflux disease with esophagitis: Secondary | ICD-10-CM | POA: Insufficient documentation

## 2018-03-04 HISTORY — DX: Nausea with vomiting, unspecified: R11.2

## 2018-03-04 HISTORY — PX: EGD: SHX3789

## 2018-03-04 SURGERY — DONT USE, USE 1095-ESOPHAGOGASTRODUODENOSCOPY (EGD), DIAGNOSTIC
Anesthesia: Anesthesia General | Site: Abdomen | Wound class: Clean Contaminated

## 2018-03-04 MED ORDER — ONDANSETRON HCL 4 MG/2ML IJ SOLN
INTRAMUSCULAR | Status: AC
Start: 2018-03-04 — End: ?
  Filled 2018-03-04: qty 2

## 2018-03-04 MED ORDER — PROPOFOL 10 MG/ML IV EMUL (WRAP)
INTRAVENOUS | Status: AC
Start: 2018-03-04 — End: ?
  Filled 2018-03-04: qty 40

## 2018-03-04 MED ORDER — SODIUM CHLORIDE 0.9 % IJ SOLN
INTRAMUSCULAR | Status: AC
Start: 2018-03-04 — End: ?
  Filled 2018-03-04: qty 10

## 2018-03-04 MED ORDER — PROPOFOL 10 MG/ML IV EMUL (WRAP)
INTRAVENOUS | Status: AC
Start: 2018-03-04 — End: ?
  Filled 2018-03-04: qty 20

## 2018-03-04 MED ORDER — EPHEDRINE SULFATE 50 MG/ML IJ/IV SOLN (WRAP)
Status: AC
Start: 2018-03-04 — End: ?
  Filled 2018-03-04: qty 1

## 2018-03-04 MED ORDER — SODIUM CHLORIDE 0.9 % IV SOLN
INTRAVENOUS | Status: DC
Start: 2018-03-04 — End: 2018-03-04

## 2018-03-04 MED ORDER — LIDOCAINE HCL 2 % IJ SOLN
INTRAMUSCULAR | Status: AC
Start: 2018-03-04 — End: ?
  Filled 2018-03-04: qty 20

## 2018-03-04 MED ORDER — LACTATED RINGERS IV SOLN
INTRAVENOUS | Status: DC
Start: 2018-03-04 — End: 2018-03-04

## 2018-03-04 MED ORDER — PROPOFOL 10 MG/ML IV EMUL (WRAP)
INTRAVENOUS | Status: DC | PRN
Start: 2018-03-04 — End: 2018-03-04
  Administered 2018-03-04: 80 mg via INTRAVENOUS
  Administered 2018-03-04: 40 mg via INTRAVENOUS
  Administered 2018-03-04: 30 mg via INTRAVENOUS

## 2018-03-04 MED ORDER — ONDANSETRON HCL 4 MG/2ML IJ SOLN
INTRAMUSCULAR | Status: DC | PRN
Start: 2018-03-04 — End: 2018-03-04
  Administered 2018-03-04: 4 mg via INTRAVENOUS

## 2018-03-04 MED ORDER — LIDOCAINE HCL 2 % IJ SOLN
INTRAMUSCULAR | Status: DC | PRN
Start: 2018-03-04 — End: 2018-03-04
  Administered 2018-03-04: 80 mg

## 2018-03-04 MED ORDER — EPHEDRINE SULFATE 50 MG/ML IJ/IV SOLN (WRAP)
Status: DC | PRN
Start: 2018-03-04 — End: 2018-03-04
  Administered 2018-03-04 (×2): 10 mg via INTRAVENOUS

## 2018-03-04 SURGICAL SUPPLY — 38 items
BLOCK BITE MAXI 60FR LF STRD STRAP SDPRT (Procedure Accessories) ×2
BLOCK BITE OD60 FR STURDY STRAP SIDEPORT (Procedure Accessories) ×1
BLOCK BITE OD60 FR STURDY STRAP SIDEPORT DENTAL RETENTION RIM MAXI (Procedure Accessories) ×1 IMPLANT
CATHETER BALLOON DILATATION CRE 2.8 MM (Balloons)
CATHETER BALLOON DILATATION CRE PEBAX (Balloons)
CATHETER OD10-11-12 MM ODSEC6 FR L180 CM CRE BALLOON DILATATION L8 CM (Balloons) IMPLANT
CATHETER OD10-11-12 MM ODSEC6 FR L180 CM CREâ„¢ BALLOON DILATATION L8 CM (Balloons) IMPLANT
CATHETER OD15-16.5-18 MM ODSEC6 FR L180 CM CRE BALLOON DILATATION L8 (Balloons) IMPLANT
CATHETER OD15-16.5-18 MM ODSEC6 FR L180 CM CREâ„¢ BALLOON DILATATION L8 (Balloons) IMPLANT
CATHETER OD6 FR ODSEC12-13.5-15 MM L180 CM CRE BALLOON DILATATION L8 (Balloons) IMPLANT
CATHETER OD6 FR ODSEC12-13.5-15 MM L180 CM CREâ„¢ BALLOON DILATATION L8 (Balloons) IMPLANT
CATHETER OD6-7-8 MM ODSEC6 FR L180 CM CRE BALLOON DILATATION L8 CM (Balloons) IMPLANT
CATHETER OD6-7-8 MM ODSEC6 FR L180 CM CREâ„¢ BALLOON DILATATION L8 CM (Balloons) IMPLANT
DILATOR ESCP PEBAX 2.8MM CRE 10-11-12MM (Balloons)
DILATOR ESCP PEBAX 2.8MM CRE 15-16.5-18 (Balloons)
DILATOR ESCP PEBAX 2.8MM CRE 6-7-8MM 6FR (Balloons)
DILATOR ESCP PEBAX CRE 6FR 12-13.5-15MM (Balloons)
FORCEPS BIOPSY L240 CM LARGE CAPACITY (Instrument) ×1
FORCEPS BIOPSY L240 CM MICROMESH TEETH STREAMLINE CATHETER NEEDLE (Instrument) ×1 IMPLANT
FORCEPS BX SS LG CPC RJ 4 2.4MM 240CM (Instrument) ×2
GLOVE EXAM LARGE NITRILE CHEMOTHERAPY POWDER FREE SENSE OATMEAL (Glove) ×2 IMPLANT
GLOVE EXAM LARGE NITRILE POWDER FREE SENSE OATMEAL (Glove) ×2 IMPLANT
GLOVE EXAM NITRILE RESTORE LG (Glove) ×2
GLV EXAM NITRILE RESTORE LG (Glove) ×6
GOWN ISL PP PE REG LG LF FULL BCK NK TIE (Gown) ×6
GOWN ISOLATION REGULAR LARGE FULL BACK NECK TIE ELASTIC CUFF (Gown) ×2 IMPLANT
SNARE ESCP MIC CPTVTR 13MM 240IN STRL (GE Lab Supplies)
SNARE SMALL HEXAGON CAPTIVATOR STIFF ENDOSCOPIC POLYPECTOMY (GE Lab Supplies) IMPLANT
SPONGE GAUZE L4 IN X W4 IN 16 PLY (Dressing) ×1
SPONGE GAUZE L4 IN X W4 IN 16 PLY MAXIMUM ABSORBENT USP TYPE VII (Dressing) ×1 IMPLANT
SPONGE GZE CTTN CRTY 4X4IN LF NS 16 PLY (Dressing) ×2
SYRINGE 50 ML GRADUATE NONPYROGENIC DEHP (Syringes, Needles)
SYRINGE 50 ML GRADUATE NONPYROGENIC DEHP FREE PVC FREE BD MEDICAL (Syringes, Needles) IMPLANT
SYRINGE INFL 60ML ALN II STRL GA DISP (Syringes, Needles)
SYRINGE INFLATION 60 ML GAUGE CRE (Syringes, Needles) IMPLANT
SYRINGE MED 50ML LF STRL GRAD N-PYRG (Syringes, Needles)
WATER STERILE PLASTIC POUR BOTTLE 250 ML (Irrigation Solutions) ×1 IMPLANT
WATER STRL 250ML LF PLS PR BTL (Irrigation Solutions) ×2

## 2018-03-04 NOTE — Discharge Instructions (Signed)
Endoscopy Discharge Instructions  General Instructions:  1. Following sedation, your judgement, perception, and coordination are considered impaired. Even though you may feel awake and alert, you are considered legally intoxicated. Therefore, until the next morning;   Do not Drive   Do not operate appliances or equipment that requires reaction time (e.g. stove, electrical tools, machinery)   Do not sign legal documents or be involved in important decisions.   Do not smoke if alone   Do not drink alcoholic beverages   Go directly home and rest for several hours before resuming your routine activities.   It is highly recommended to have a responsible adult stay with you for the next 24 hours    2. Tenderness, swelling or pain may occur at the IV site where you received sedation. If you experience this, apply warm soaks to the area. Notify your physician if this persists.    Instructions Specific To Procedures - Report To Physician Any Of The Following:    Upper Endoscopy   1. Pain in Chest   2. Nausea/vomitting   3. Fevers/Chills within 24 hours after procedure. Temp>101deg F   4. Severe and persistent abdominal pain and bloating     In Addition:   Mild throat soreness may follow this procedure. Warm salt water gargling or    lozenges of your choice will most likely relieve your discomfort or cold drinks and   popsicles.     Additional Discharge Instructions  Your diet after the procedure: No restriction, avoid anything red colored, spicy or greasy for 24 hours.   Special Instructions: None  Prescriptions given: No  Patient education literature given; No      If you have questions or problems contact your MD immediately. If you need immediate attention, call your MD, 911 and/or go to nearest emergency room.    Post Anesthesia Discharge Instructions    Although you may be awake and alert in the recovery room, small amounts of anesthetic remain in your system for about 24 hours.  You may feel tired and sleepy  during this time.      You are advised to go directly home from the hospital.    Plan to stay at home and rest for the remainder of the day.    It is advisable to have someone with you at home for 24 hours after surgery.    Do not operate a motor vehicle, or any mechanical or electrical equipment for the next 24 hours.      Be careful when you are walking around, you may become dizzy.  The effects of anesthesia and/or medications are still present and drowsiness may occur    Do not consume alcohol, tranquilizers, sleeping medications, or any other non prescribed medication for the remainder of the day.    Diet:  begin with liquids, progress your diet as tolerated or as directed by your surgeon.  Nausea and vomiting may occur in the next 24 hours.

## 2018-03-04 NOTE — Anesthesia Preprocedure Evaluation (Addendum)
Anesthesia Evaluation    AIRWAY    Mallampati: I    TM distance: >3 FB  Neck ROM: full  Mouth Opening:full   CARDIOVASCULAR    regular and normal       DENTAL         PULMONARY    clear to auscultation     OTHER FINDINGS                  Relevant Problems   No relevant active problems               Anesthesia Plan    ASA 2     general               (Discussed health history and anesthesia plan)      intravenous induction   Detailed anesthesia plan: general IV        Post op pain management: per surgeon    informed consent obtained      pertinent labs reviewed             Signed by: Renaldo Fiddler 03/04/18 8:12 AM

## 2018-03-04 NOTE — Anesthesia Postprocedure Evaluation (Signed)
Anesthesia Post Evaluation    Patient: Natalie Dean    Procedure(s) with comments:  EGD - EGD  Q1=UNK     Anesthesia type: general    Last Vitals:   Vitals:    03/04/18 1030   BP: 101/58   Pulse: 81   Resp:    Temp:    SpO2: 98%       Anesthesia Post Evaluation:     Patient Evaluated: bedside  Patient Participation: complete - patient participated  Level of Consciousness: awake and alert  Pain Score: 0          Anesthetic complications: No      PONV Status: none    Cardiovascular status: acceptable  Respiratory status: acceptable  Hydration status: acceptable        Signed by: Renaldo Fiddler, 03/04/2018 10:36 AM

## 2018-03-04 NOTE — Transfer of Care (Signed)
Anesthesia Transfer of Care Note    Patient: Natalie Dean    Procedures performed: Procedure(s) with comments:  EGD - EGD  Q1=UNK     Anesthesia type: General TIVA    Patient location:Phase II PACU    Last vitals:   Vitals:    03/04/18 1030   BP: 101/58   Pulse: 81   Resp:    Temp:    SpO2: 98%       Post pain: Patient not complaining of pain, continue current therapy      Mental Status:awake    Respiratory Function: tolerating room air    Cardiovascular: stable    Nausea/Vomiting: patient not complaining of nausea or vomiting    Hydration Status: adequate    Post assessment: no apparent anesthetic complications    Signed by: Renaldo Fiddler  03/04/18 10:33 AM

## 2018-03-04 NOTE — H&P (Signed)
GI PRE PROCEDURE NOTE    Proceduralist Comments:   Review of Systems and Past Medical / Surgical History performed: Yes     Indications:GERD     Previous Adverse Reaction to Anesthesia or Sedation (if yes, describe): No    Physical Exam / Laboratory Data (If applicable)   General: Alert and cooperative  Lungs: Lungs clear to auscultation  Cardiac: RRR, normal S1S2.    Abdomen: Soft, non tender. Normal active bowel sounds  Other:     No labs drawn    American Society of Anesthesiologists (ASA) Physical Status Classification:   Anesthesia ASA Score: 2          Planned Sedation:   Deep sedation with anesthesia    Attestation:   Natalie Dean has been reassessed immediately prior to the procedure and is an appropriate candidate for the planned sedation and procedure. Risks, benefits and alternatives to the planned procedure and sedation have been explained to the patient or guardian:  yes        Signed by: Juanetta Snow

## 2018-03-07 ENCOUNTER — Encounter: Payer: Self-pay | Admitting: Gastroenterology

## 2018-03-07 LAB — LAB USE ONLY - HISTORICAL SURGICAL PATHOLOGY

## 2018-08-19 ENCOUNTER — Encounter (INDEPENDENT_AMBULATORY_CARE_PROVIDER_SITE_OTHER): Payer: Self-pay

## 2019-07-06 ENCOUNTER — Other Ambulatory Visit: Payer: Self-pay | Admitting: Internal Medicine
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
## 2022-10-21 IMAGING — CT CT LUMBAR SPINE WITHOUT CONTRAST
2 series · 13 of 27 positions shown, 16 images · non-contrast
Comparison: MRI lumbar spine from 05/05/2021.

________________________________________________________________________________________________ 
CT LUMBAR SPINE WITHOUT CONTRAST, 10/21/2022 [DATE]: 
CLINICAL INDICATION: Severe low back pain. Radiation to right hip and thigh. 
A search for DICOM formatted images was conducted for prior CT imaging studies 
completed at a non-affiliated media free facility.
TECHNIQUE: The lumbar spine was scanned from T12 through mid-sacrum without 
contrast on a high-resolution CT scanner using dose reduction techniques.

[Series 2: axial · axial · 0.36mm/px · z∈[-278,-80]mm · 8 of 236 slices shown, 10 images]
[im 19/236  soft-tissue]
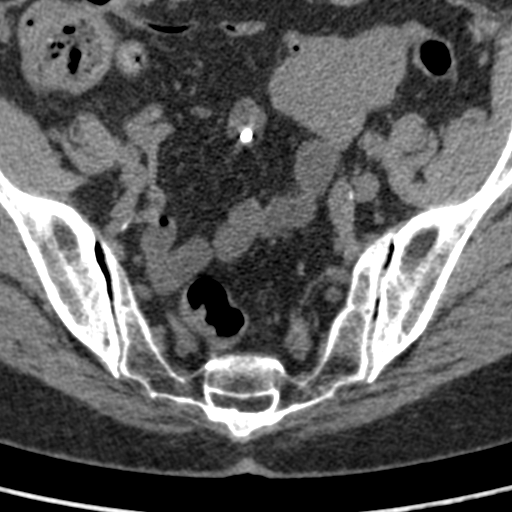
[im 19/236  bone]
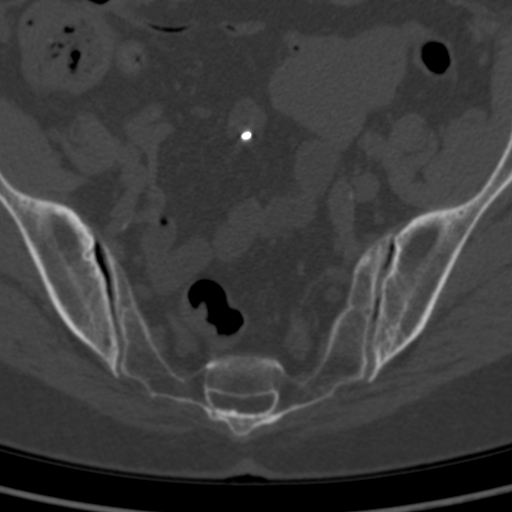
[im 55/236  bone]
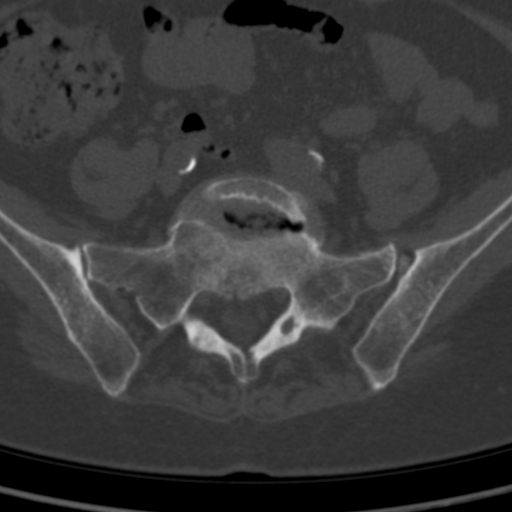
[im 73/236  bone]
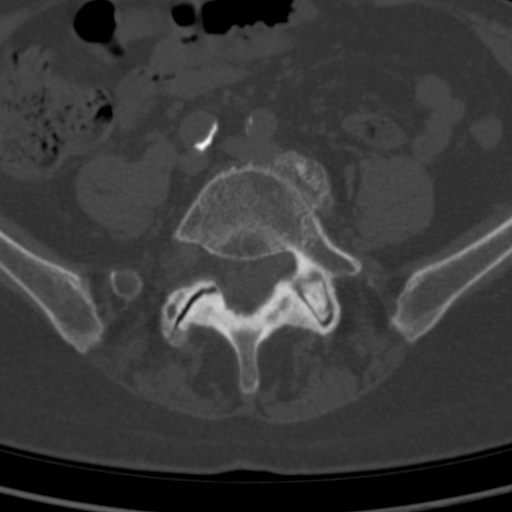
[im 109/236  bone]
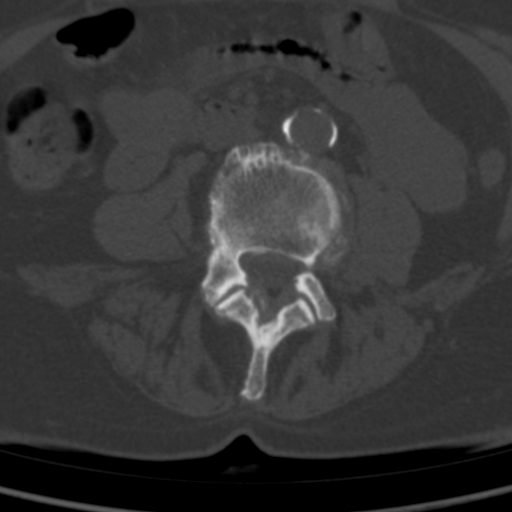
[im 127/236  soft-tissue]
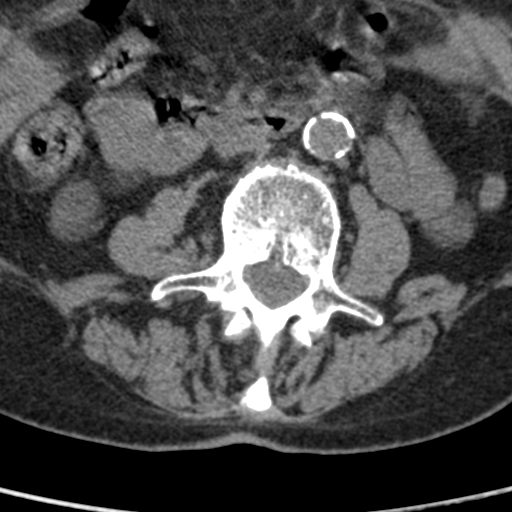
[im 127/236  bone]
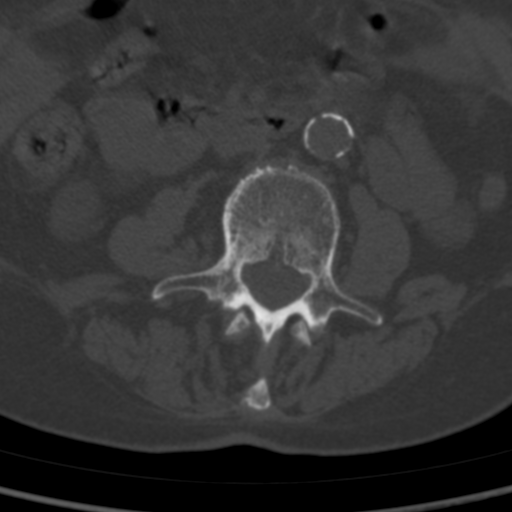
[im 163/236  bone]
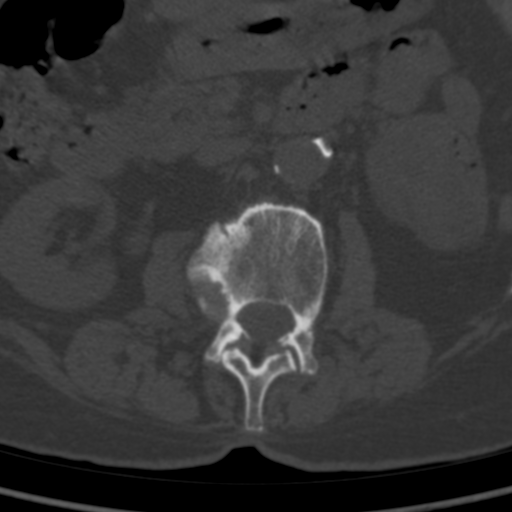
[im 181/236  bone]
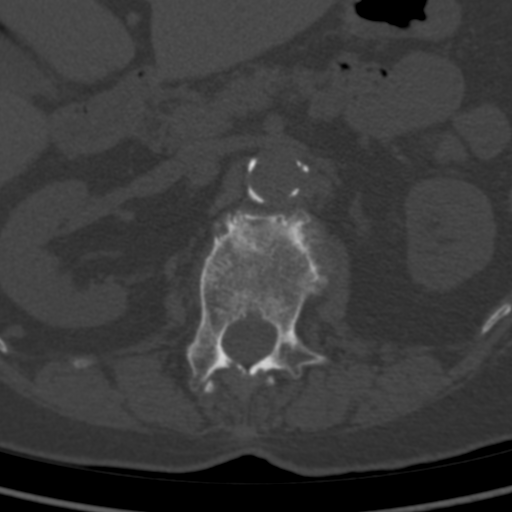
[im 217/236  bone]
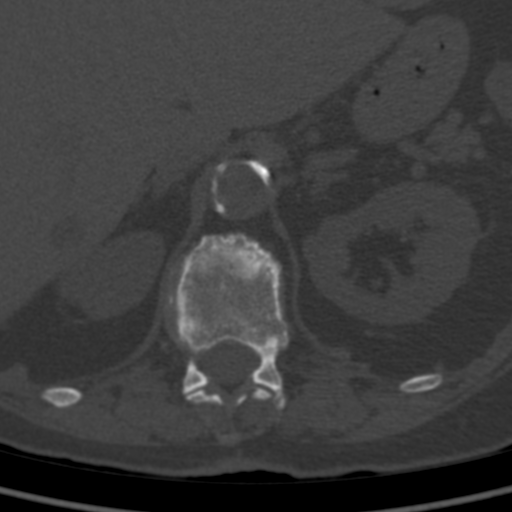

[Series 6: sag st · sagittal · 0.34mm/px · 5 of 153 slices shown, 6 images]
[im 51/153  bone]
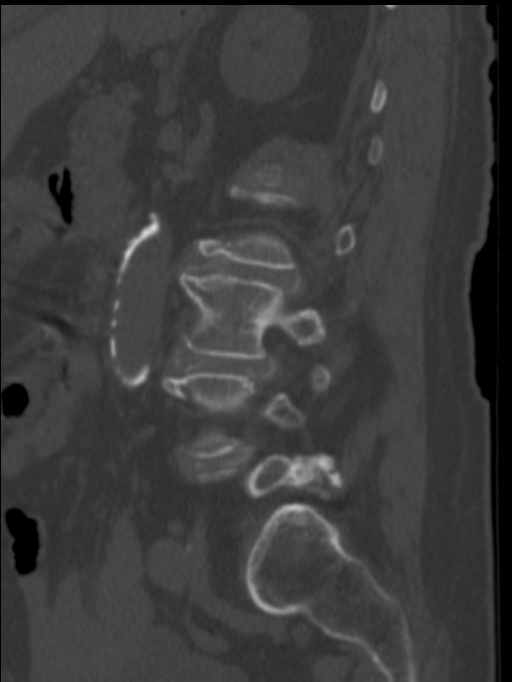
[im 64/153  bone]
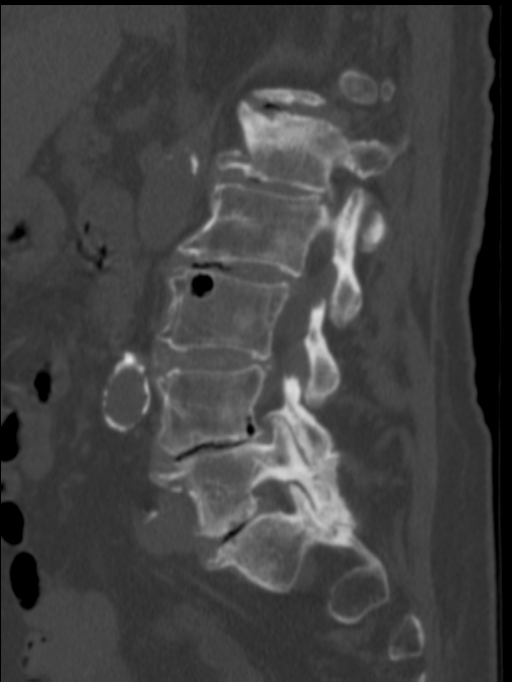
[im 77/153  soft-tissue]
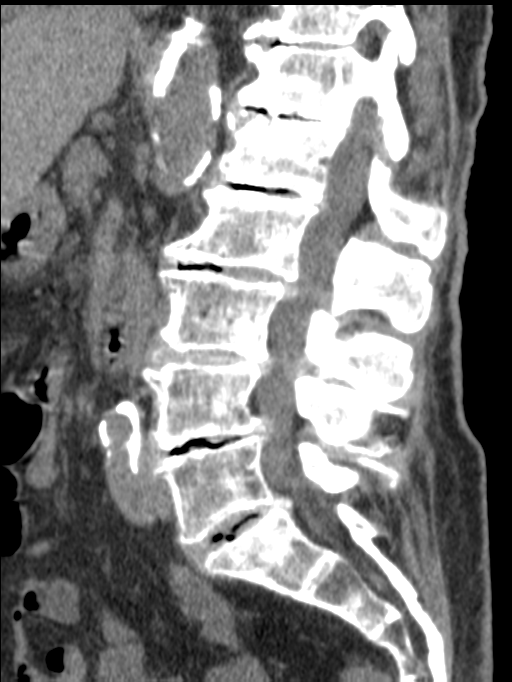
[im 77/153  bone]
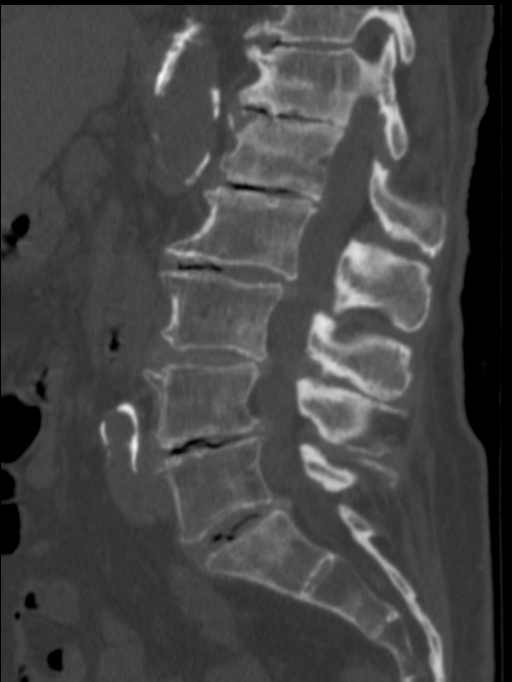
[im 89/153  bone]
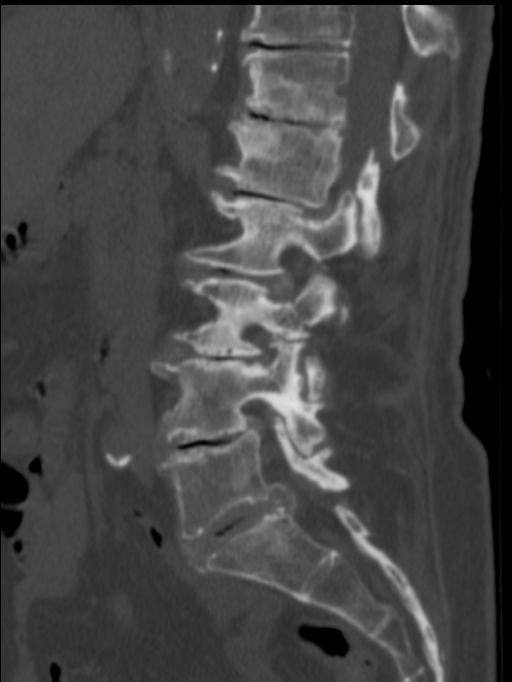
[im 102/153  bone]
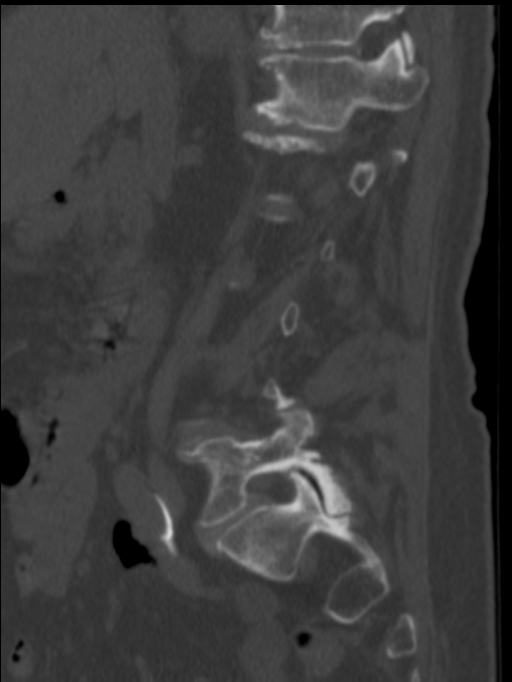

[13 of 27 positions shown; findings below may reference images not displayed]

MRI lumbar spine from 10/01/2022 
was not able to be loaded at time of interpretation.
FINDINGS: -------------------------------------------------------------------------------- 
------ 
Leftward curvature of lumbar spine centered at L3. Mild left lateral subluxation 
of L4 relative to L5. Mild retrolisthesis of L1 on L2, L2 on L3, L3 on L4. No 
acute fracture. No focal suspicious lytic or blastic lesions are present. 
Visualized extraspinal soft tissues reveal vascular calcifications and right 
hepatic cyst. 
Multilevel discogenic/degenerative changes are present. 
Levels with moderate/severe central canal narrowing: L4-L5 
Levels with moderate/severe neural foraminal narrowing: L1-L2 (moderate right), 
L3-L4 (moderate right), L4-L5 (moderate left), L5-S1 (moderate left). 
No significant change in segmental analysis compared to prior MRI allowing for 
differences in modality. 

-------------------------------------------------------------------------------- 
------
IMPRESSION: Degenerative changes in lumbar spine.  
RADIATION DOSE REDUCTION: All CT scans are performed using radiation dose 
reduction techniques, when applicable.  Technical factors are evaluated and 
adjusted to ensure appropriate moderation of exposure.  Automated dose 
management technology is applied to adjust the radiation doses to minimize 
exposure while achieving diagnostic quality images.

## 2022-11-12 IMAGING — MR MRI LUMBAR SPINE WITHOUT CONTRAST
5 of 9 series · 13 of 48 positions shown · IV contrast (gadolinium)
Comparison: CT lumbar spine October 21, 2022

________________________________________________________________________________________________ 
MRI LUMBAR SPINE WITHOUT CONTRAST, 11/12/2022 [DATE]: 
CLINICAL INDICATION: Low back pain, radiculopathy
TECHNIQUE: Sagittal T1, Sagittal T2, Sagittal STIR, Axial T1 and Axial T2 MR 
images of the lumbar spine were performed without intravenous gadolinium 
enhancement.

[Series 101: survey · axial · 10.0mm · 1.25mm/px · z∈[-33,+201]mm · 2 of 10 slices shown]
[im 1/10]
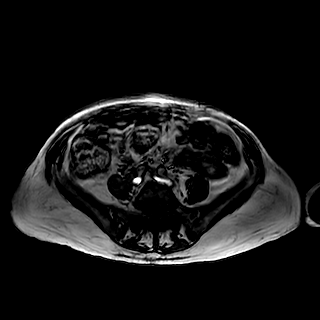
[im 10/10]
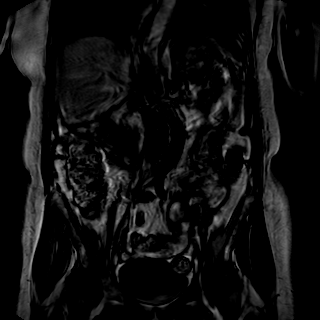

[Series 201: t2w_cor-surv · coronal · 6.0mm · 0.62mm/px · 2 of 13 slices shown]
[im 1/13]
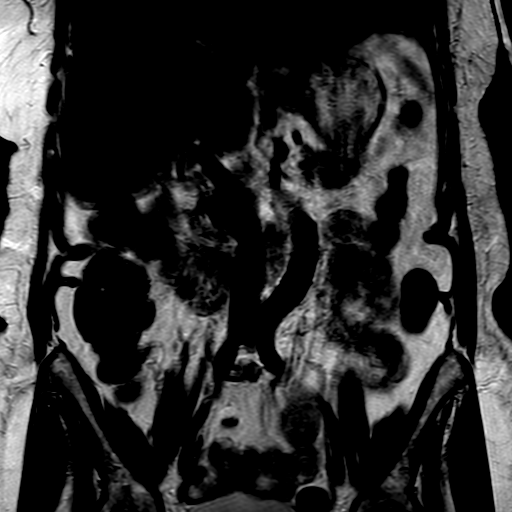
[im 13/13]
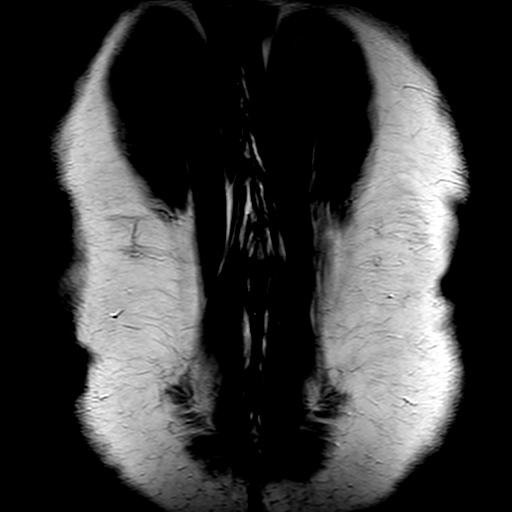

[Series 301: t1_tse_sag · sagittal · 4.0mm · 0.41mm/px · 2 of 18 slices shown]
[im 1/18]
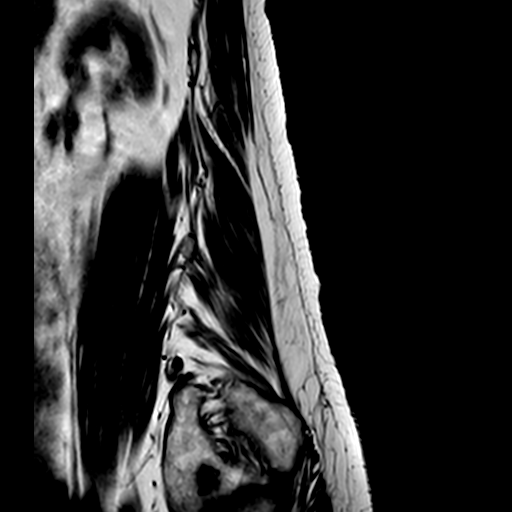
[im 18/18]
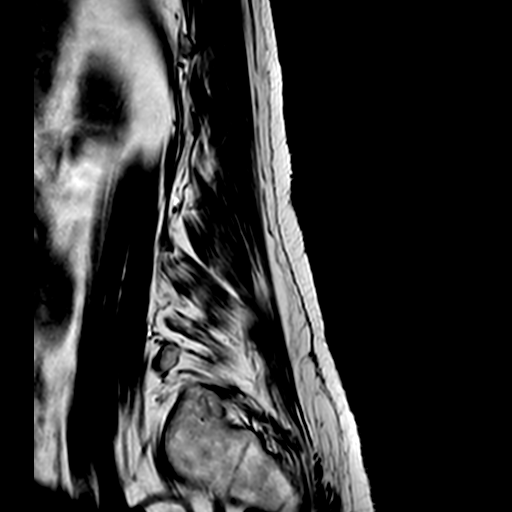

[Series 402: (id) view_ax mpr · axial · 1.0mm · 0.25mm/px · z∈[-63,-32]mm · 2 of 104 slices shown]
[im 1/104]
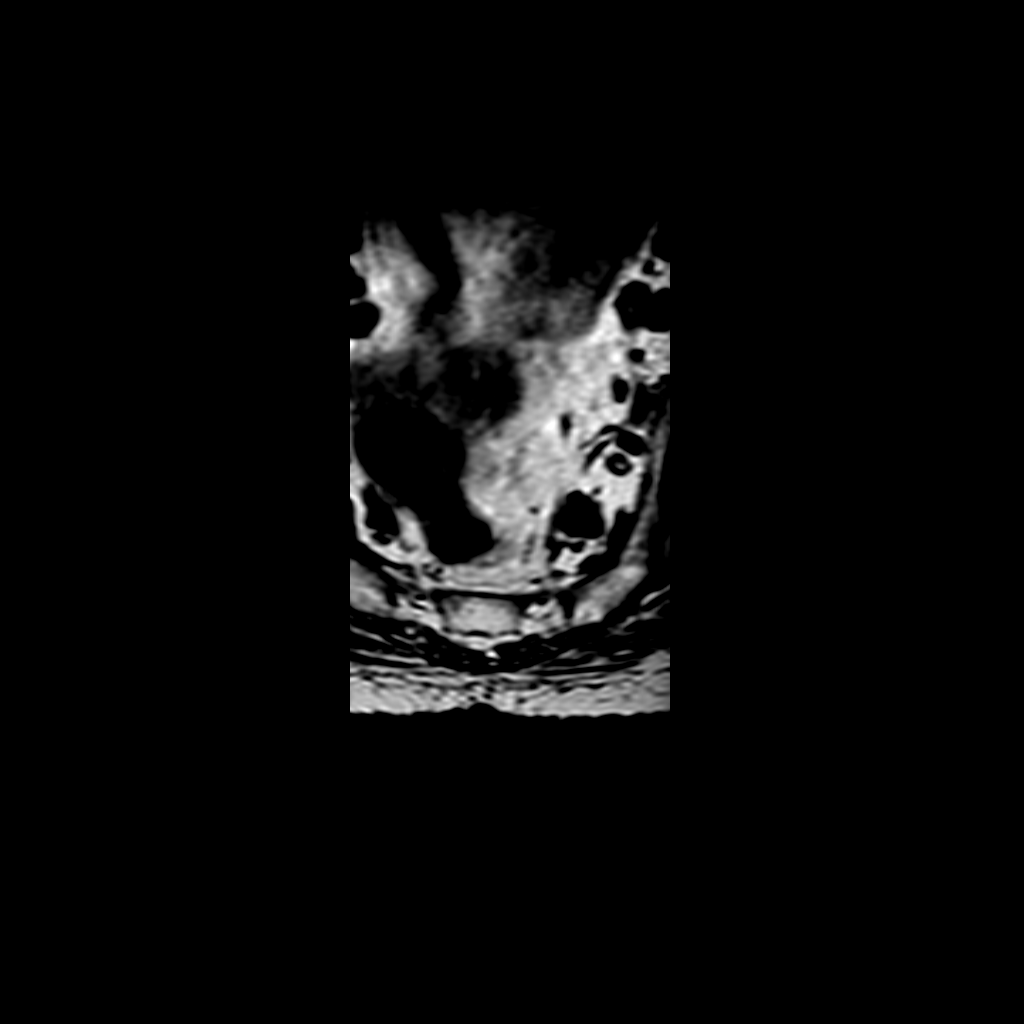
[im 16/104]
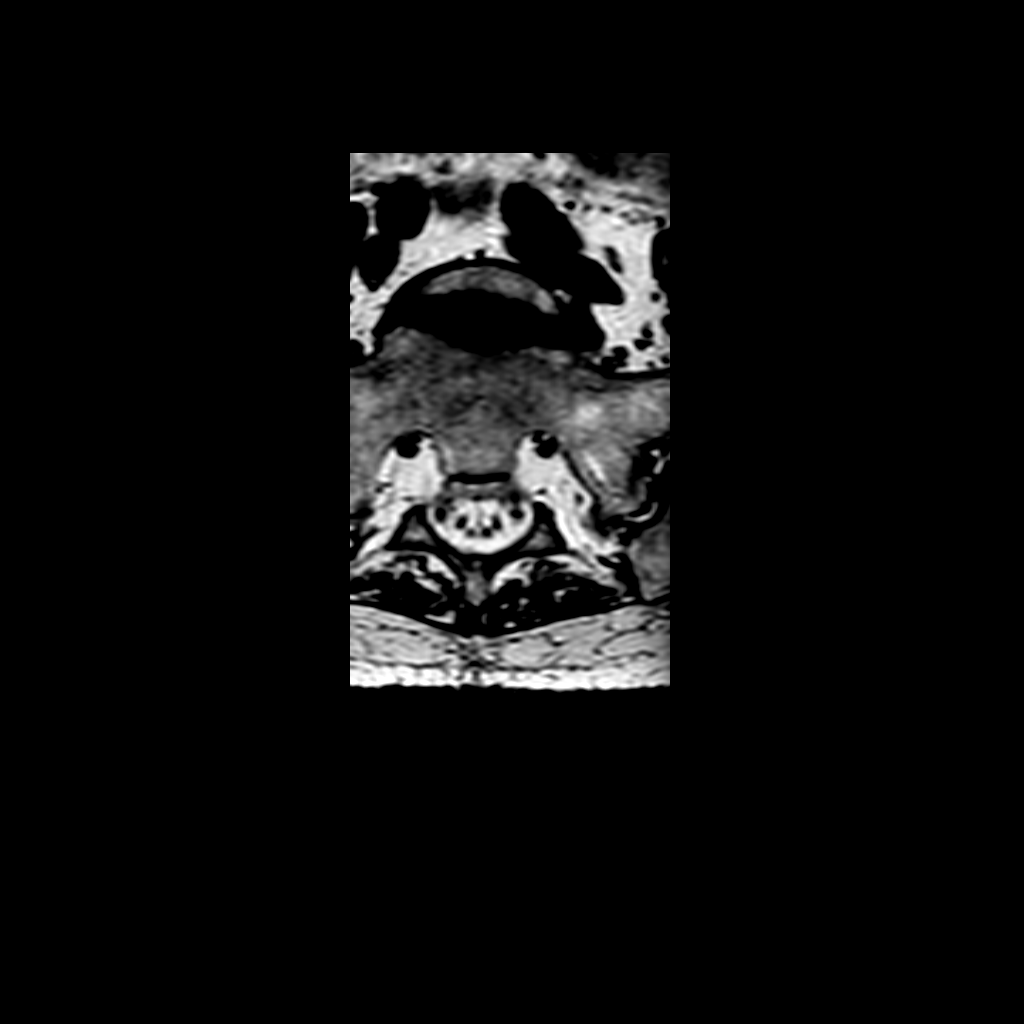

[Series 701: T1 · axial · 4.0mm · 0.39mm/px · z∈[-58,+99]mm · 5 of 36 slices shown]
[im 1/36]
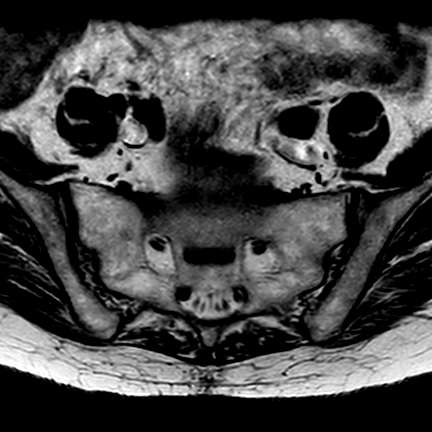
[im 9/36]
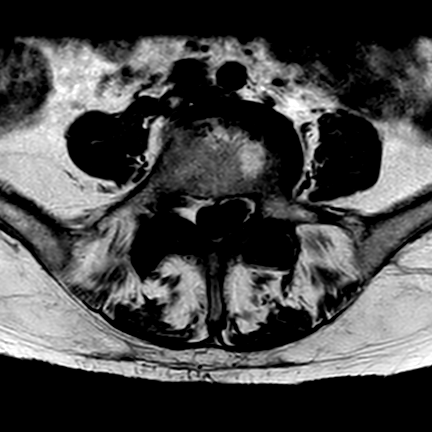
[im 18/36]
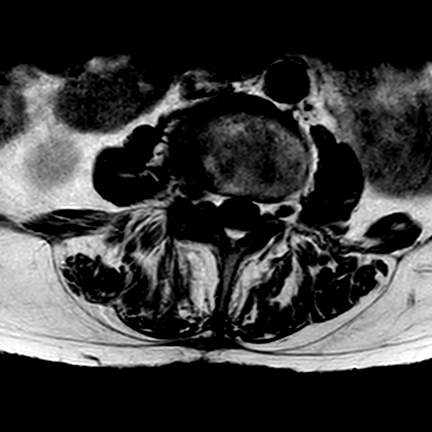
[im 27/36]
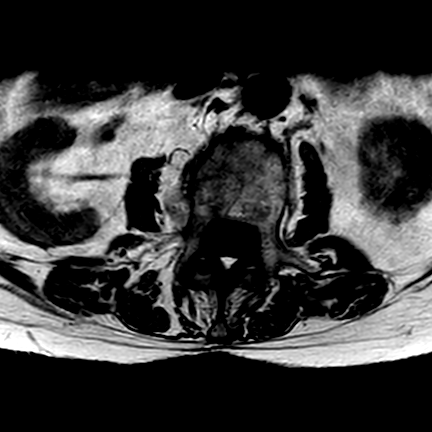
[im 36/36]
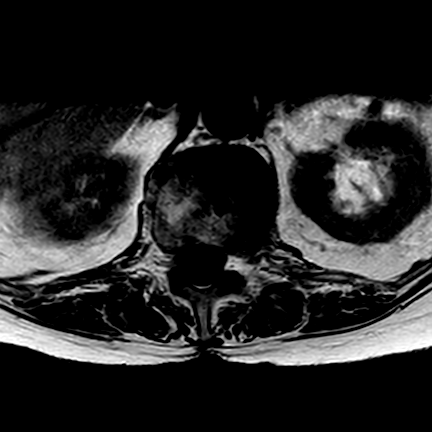

[13 of 48 positions shown; findings below may reference images not displayed]

FINDINGS: Lumbar vertebral heights are intact. There is moderate to marked disc 
narrowing throughout the lumbar spine. Conus terminates opposite T12. There is 
mild to moderate upper lumbar levoscoliosis. There are chronic Schmorls nodes 
at several levels. A 8 mm round sclerotic focus in the L3 vertebral segment is 
consistent with subchondral degenerative cyst. No specific findings to indicate 
spinal malignancy. 
There is mild Modic type I change on the right at L3-4, also at T12-L1 and L1-2. 
There are moderate to marked facet degenerative changes throughout the lumbar 
spine. 
At L5-S1 there is right paracentral disc extrusion measuring approximately 11 mm 
vertically, 5 mm AP dimension, 9 mm coronal plane, deforming the right ventral 
thecal sac and impinging the right S1 nerve root, in combination with a 7 by 5 
mm synovial cyst. Findings best seen on axial T2 image 3. There is mild to 
moderate left foraminal stenosis, right foramen open. 
At L4-5 there is moderate canal stenosis due to facet and ligamentous 
hypertrophy and midline disc bulge. There is encroachment on the L5 nerve roots 
bilaterally. There is mild right foraminal stenosis, left foramen open. 
At L3-4 there is mild to moderate canal stenosis, worse on the right, 
encroaching on the right L4 nerve root. There is mild to moderate right 
foraminal stenosis, minimal on the left. 
At L2-3 there is 2 mm retrolisthesis, disc bulge and ligamentous thickening 
contributing to mild to moderate canal stenosis, worse on the right, encroaching 
on the exiting right L3 nerve root. Foramina are open. 
At L1-2 there is mild canal stenosis. Foramina are open.
IMPRESSION: Multilevel degenerative changes associated with upper lumbar levoscoliosis. At 
L5-S1 there is a right paracentral disc extrusion and 7 by 5 mm right synovial 
cyst, impinging the right S1 nerve root. 
Mild to moderate canal stenosis as described at L2-3, L3-4 and L4-5. 
Mild Modic type I change on the right at L3-4, also mild involvement at T12-L1 
and L1-2. 
No evidence for spinal malignancy.

## 2024-01-17 IMAGING — CT CT THORACIC SPINE WITHOUT CONTRAST
3 series · 12 of 33 positions shown, 14 images · non-contrast
Comparison: X-ray thoracic spine from October 07, 2023.

________________________________________________________________________________________________ 
CT THORACIC SPINE WITHOUT CONTRAST, 01/17/2024 [DATE]: 
CLINICAL INDICATION: Back pain with radiation to right flank. 
A search for DICOM formatted images was conducted for prior CT imaging studies 
completed at a non-affiliated media free facility.
TECHNIQUE: The thoracic spine was scanned from C7 through L1 vertebra without 
contrast on a high-resolution CT scanner using dose reduction techniques. 
Routine MPR images were performed.

[Series 5: axial · axial · 0.36mm/px · z∈[-228,-40]mm · 4 of 136 slices shown, 5 images]
[im 21/136  soft-tissue]
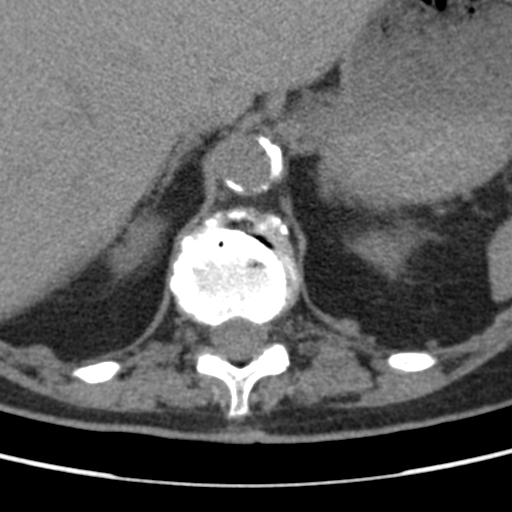
[im 21/136  bone]
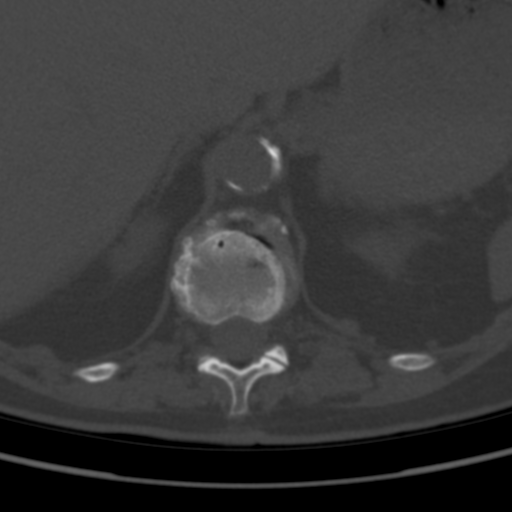
[im 52/136  bone]
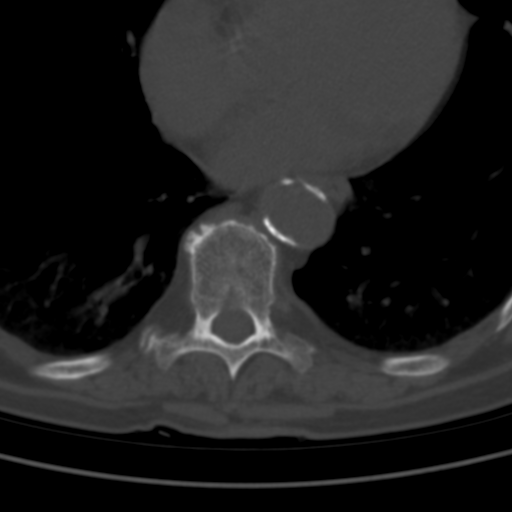
[im 84/136  bone]
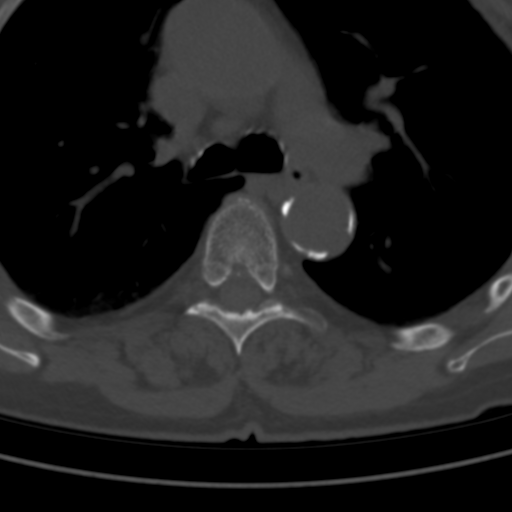
[im 115/136  bone]
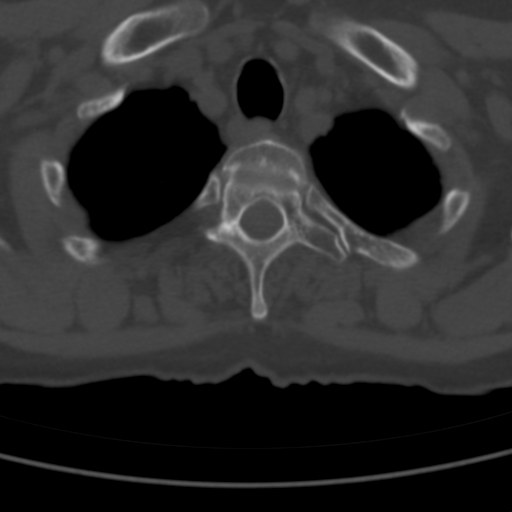

[Series 7: cor · coronal · 0.36mm/px · 3 of 81 slices shown]
[im 17/81  bone]
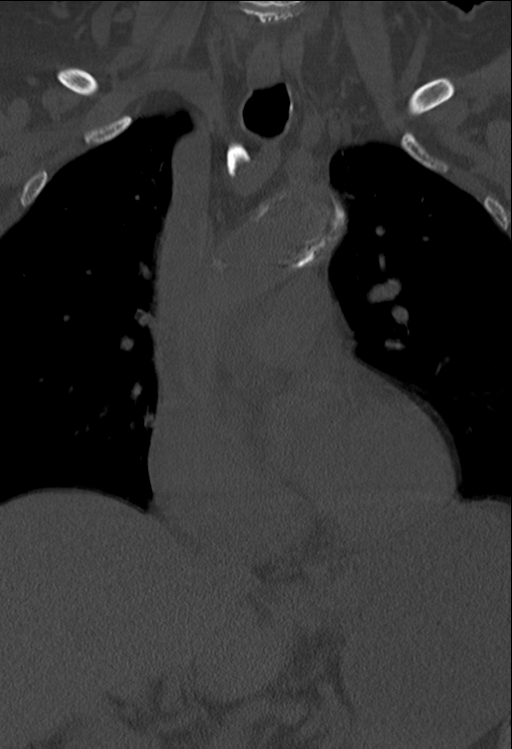
[im 33/81  bone]
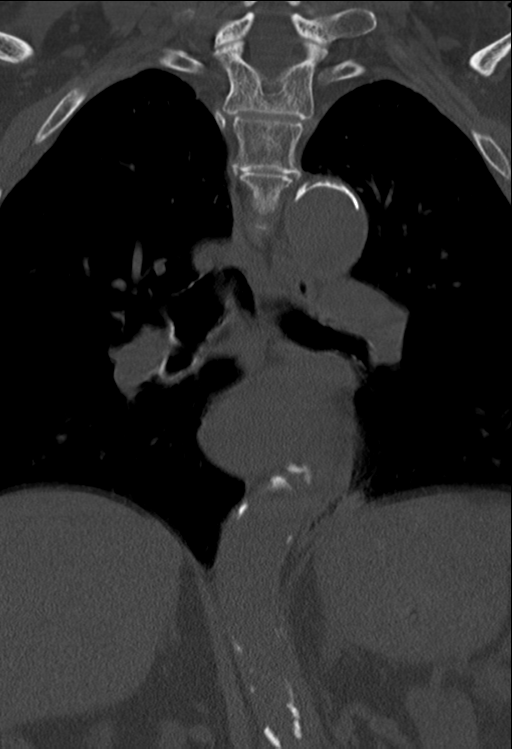
[im 49/81  bone]
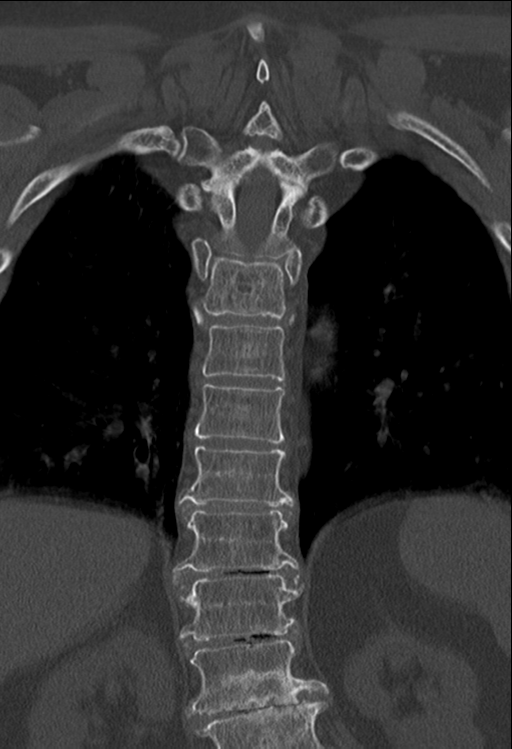

[Series 9: sag st · sagittal · 0.31mm/px · 5 of 90 slices shown, 6 images]
[im 30/90  bone]
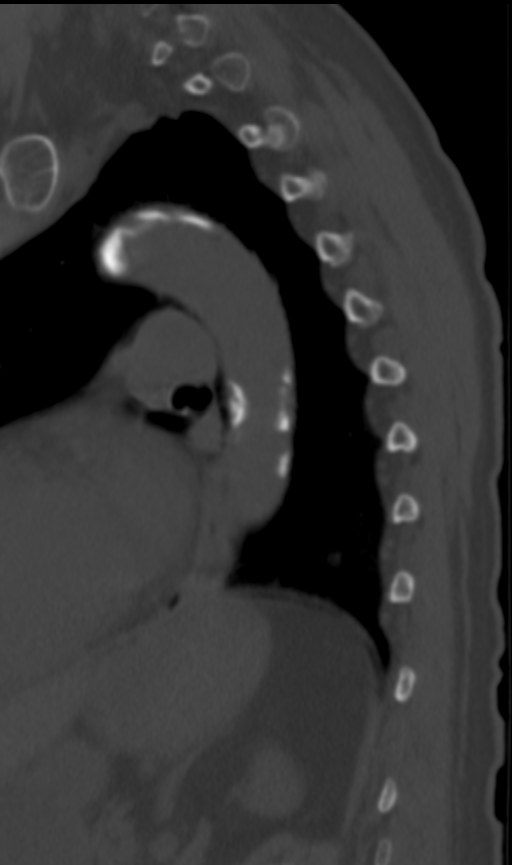
[im 38/90  bone]
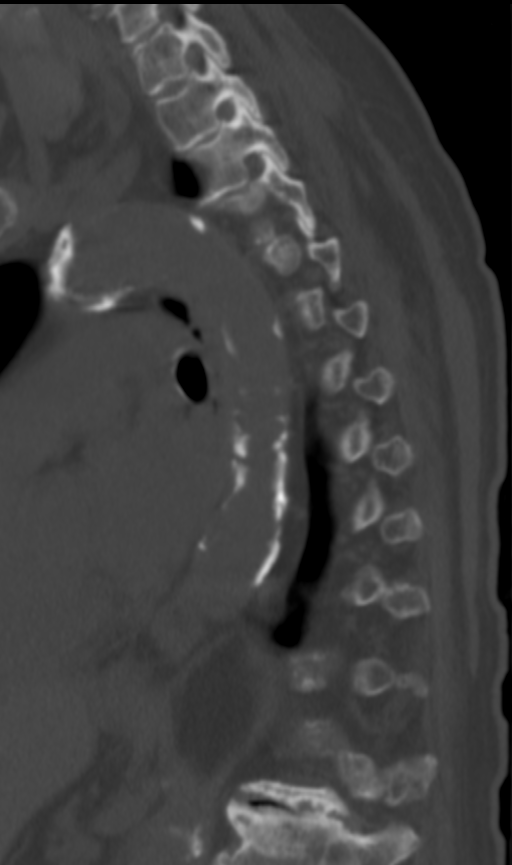
[im 45/90  soft-tissue]
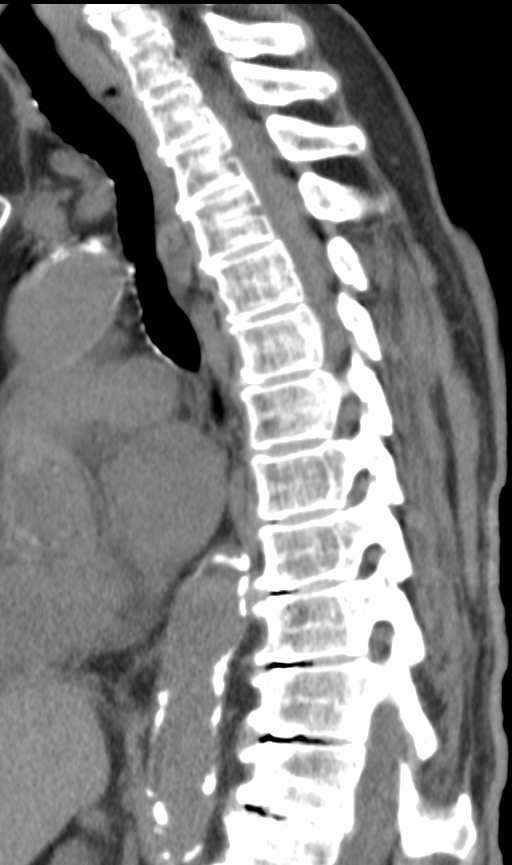
[im 45/90  bone]
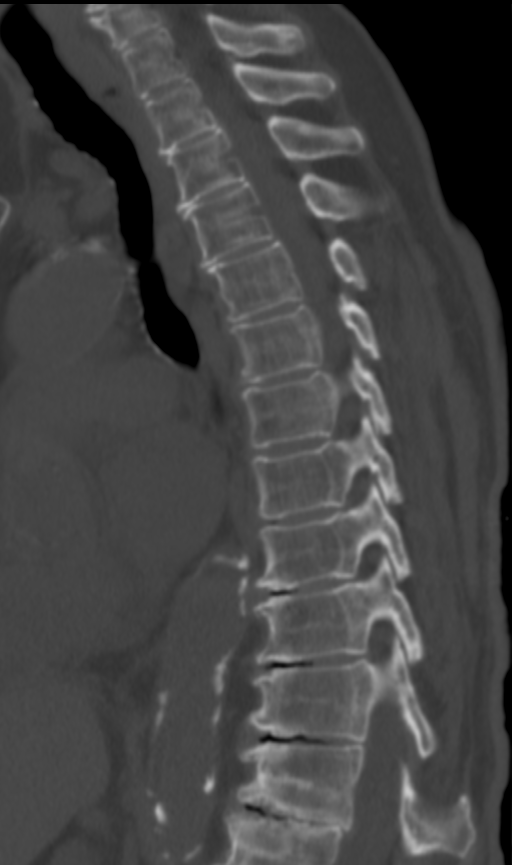
[im 52/90  bone]
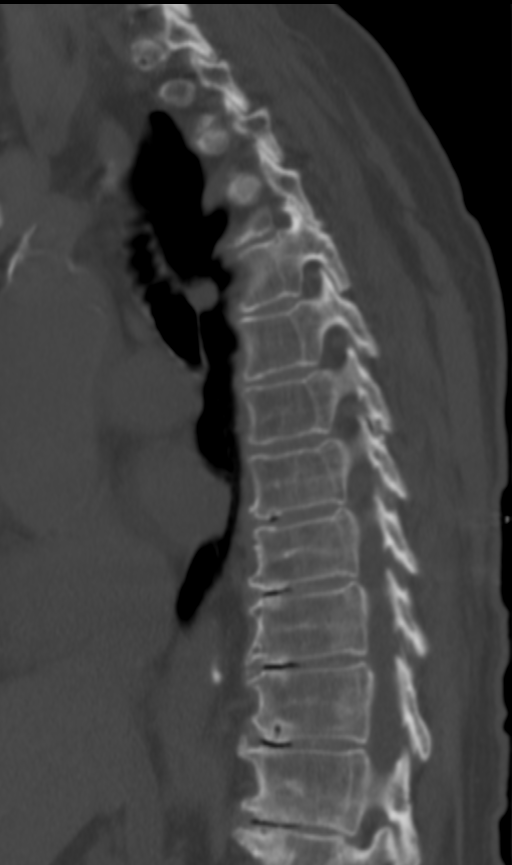
[im 60/90  bone]
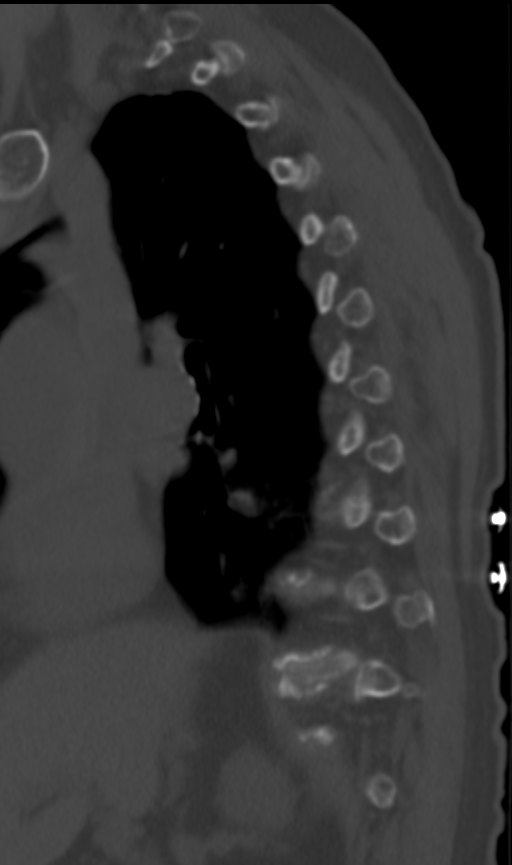

[12 of 33 positions shown; findings below may reference images not displayed]

Count of known CT and Cardiac Nuclear Medicine studies performed in the previous 
12 months = 0.
FINDINGS: --------------------------------------------------------------------------- 
GENERAL: 
Alignment demonstrates rightward curvature of the thoracic spine. No acute 
fracture. No focal suspect lytic or blastic lesion. Visualized extraspinal soft 
tissues reveal vascular calcification. Hepatic cyst. Left renal cyst. 
--------------------------------------------------------------------------- 
SEGMENTAL: 
Multilevel discogenic/degenerative changes are present. 
Levels with severe central canal narrowing: None. 
Levels with severe neural foraminal narrowing: None. 

---------------------------------------------------------------------------
IMPRESSION: Thoracic degenerative change without severe stenosis. 
RADIATION DOSE REDUCTION: All CT scans are performed using radiation dose 
reduction techniques, when applicable.  Technical factors are evaluated and 
adjusted to ensure appropriate moderation of exposure.  Automated dose 
management technology is applied to adjust the radiation doses to minimize 
exposure while achieving diagnostic quality images.

## 2024-01-17 IMAGING — CT CT LUMBAR SPINE WITHOUT CONTRAST
3 of 4 series · 11 of 33 positions shown, 13 images · non-contrast
Comparison: MRI lumbar spine from November 12, 2022.

________________________________________________________________________________________________ 
CT LUMBAR SPINE WITHOUT CONTRAST, 01/17/2024 [DATE]: 
CLINICAL INDICATION: Back pain, radiates to right flank. 
A search for DICOM formatted images was conducted for prior CT imaging studies 
completed at a non-affiliated media free facility.
TECHNIQUE: The lumbar spine was scanned from T12 through mid-sacrum without 
contrast on a high-resolution CT scanner using dose reduction techniques. 
Routine MPR reconstructions were performed.

[Series 10: axial st · axial · 0.36mm/px · z∈[-434,-254]mm · 3 of 136 slices shown, 4 images]
[im 23/136  soft-tissue]
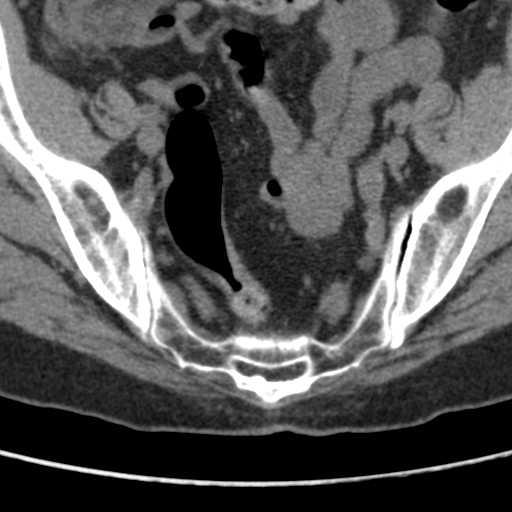
[im 23/136  bone]
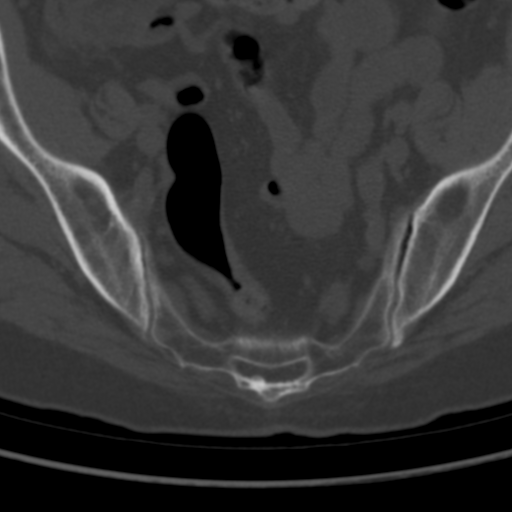
[im 68/136  bone]
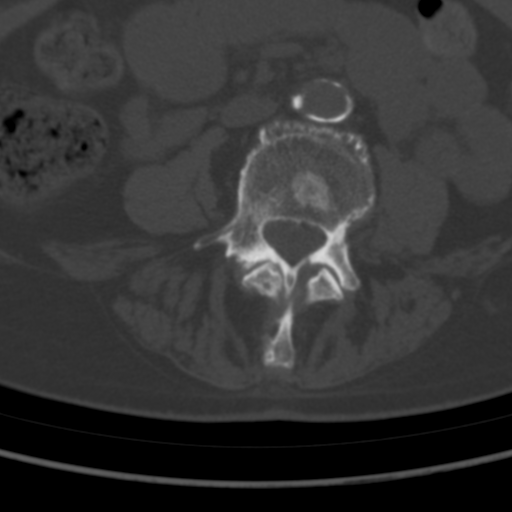
[im 113/136  bone]
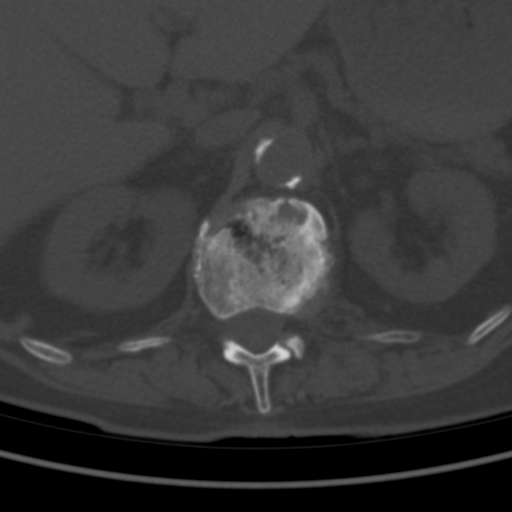

[Series 14: sag st · sagittal · 0.31mm/px · 5 of 90 slices shown, 6 images]
[im 30/90  bone]
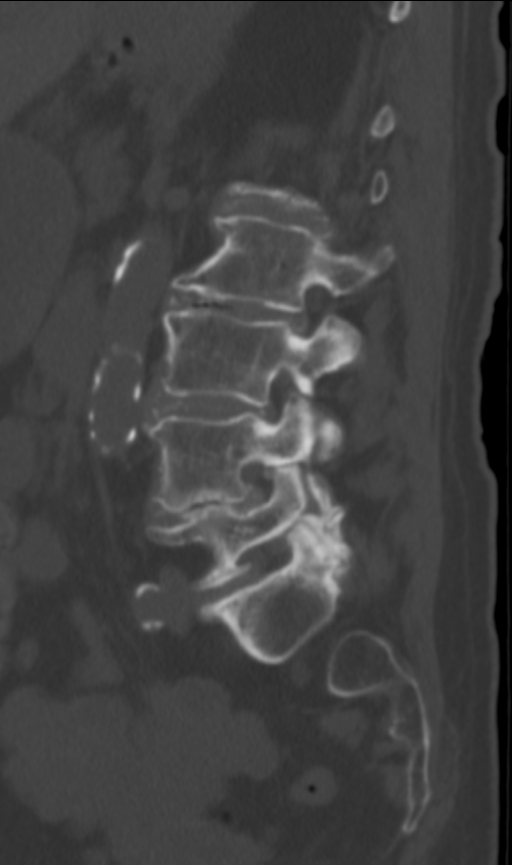
[im 38/90  bone]
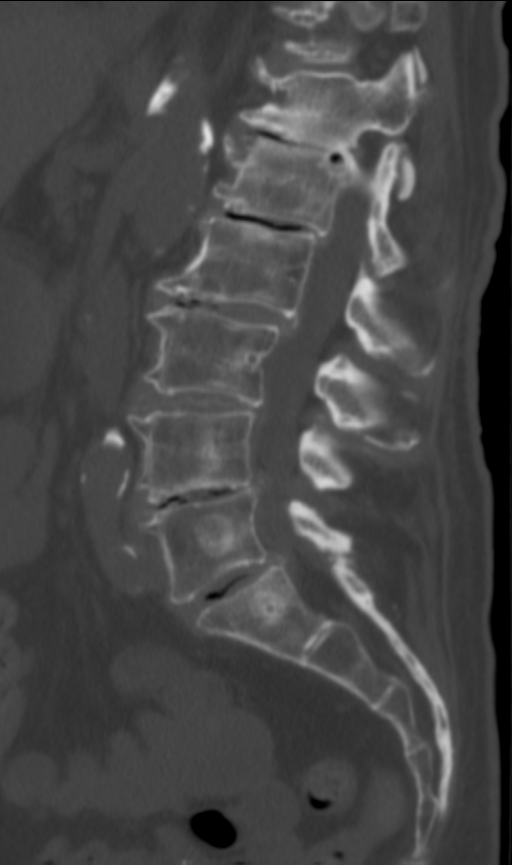
[im 45/90  soft-tissue]
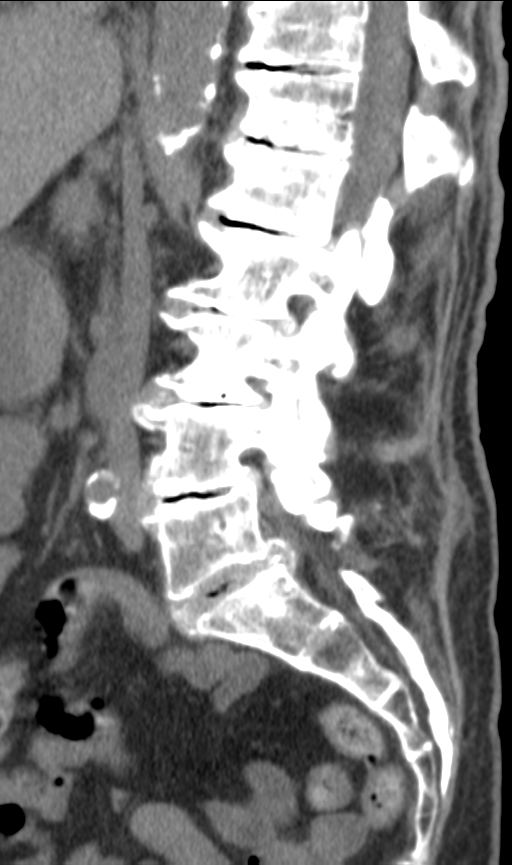
[im 45/90  bone]
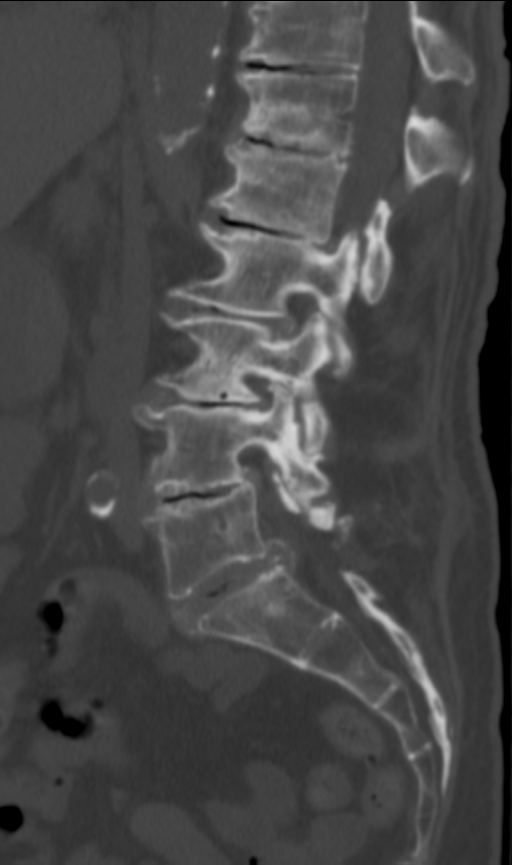
[im 52/90  bone]
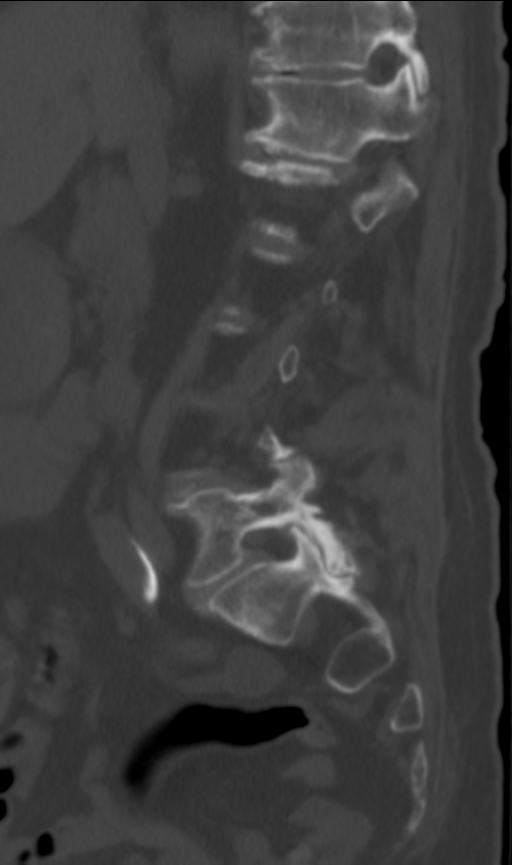
[im 60/90  bone]
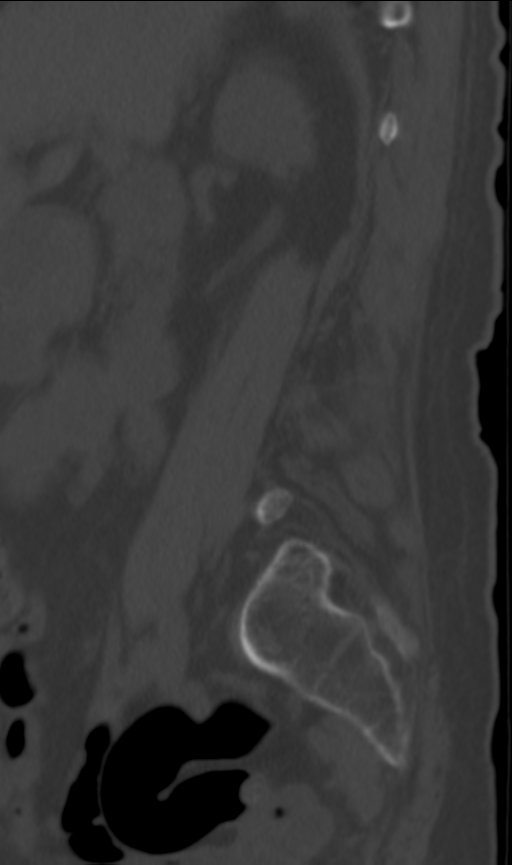

[Series 15: cor st · coronal · 0.35mm/px · 3 of 78 slices shown]
[im 16/78  bone]
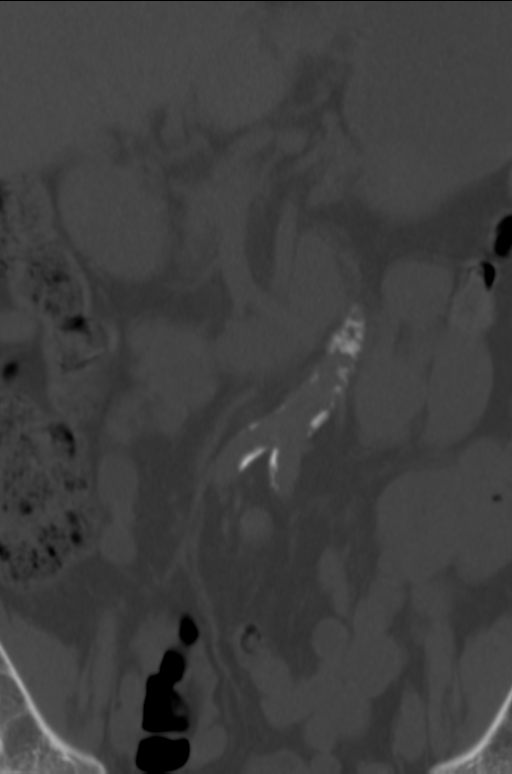
[im 31/78  bone]
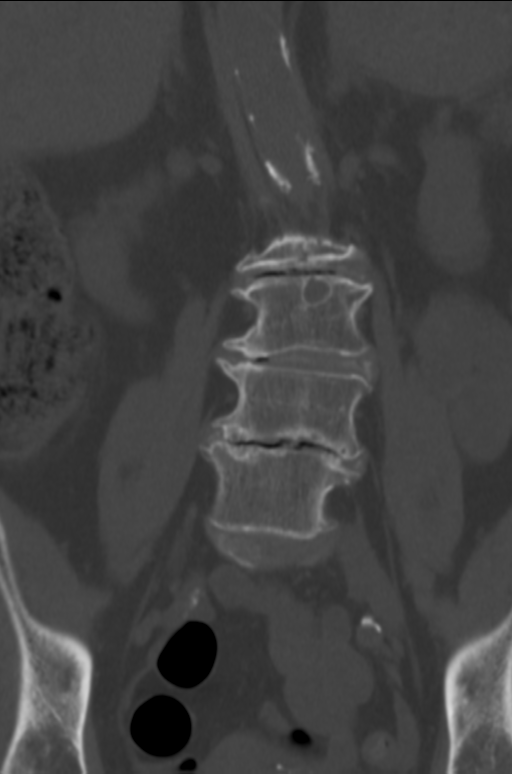
[im 47/78  bone]
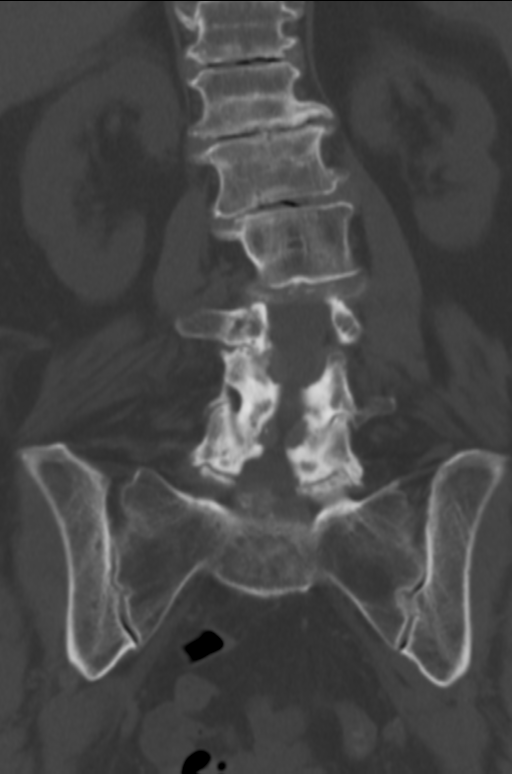

[11 of 33 positions shown; findings below may reference images not displayed]

Count of known CT and Cardiac Nuclear Medicine studies performed in the previous 
12 months = 0.
FINDINGS: -------------------------------------------------------------------------------- 
------ 
GENERAL:  
Leftward curvature of the lumbar spine. Mild retrolisthesis of L1 on L2, L2 on 
L3, L3 on L4. Mild anterolisthesis of L5 on S1. There are sclerotic round foci 
within L4, L5, S1 vertebral bodies from prior Intracept procedure. No acute 
fracture. No focal suspect lytic or blastic lesion. Visualized extraspinal soft 
tissues reveal vascular calcifications. Hepatic cysts. 
-------------------------------------------------------------------------------- 
------ 
RELEVANT SEGMENTAL (levels with severe stenosis or significant findings): 
L4-L5: Loss of disc height with vacuum disc phenomenon. Disc bulge and endplate 
osteophyte production. Bilateral facet/ligamentum flavum hypertrophy. Moderate 
central canal narrowing with suspected severe left subarticular recess 
narrowing. Mild bilateral neural foraminal narrowing. This level is stable. 
Additional scattered discogenic/degenerative changes are noted. 
-------------------------------------------------------------------------------- 
------
IMPRESSION: Severe left subarticular recess narrowing at L4-L5, please correlate for 
traversing left L5 nerve radiculopathy.  
RADIATION DOSE REDUCTION: All CT scans are performed using radiation dose 
reduction techniques, when applicable.  Technical factors are evaluated and 
adjusted to ensure appropriate moderation of exposure.  Automated dose 
management technology is applied to adjust the radiation doses to minimize 
exposure while achieving diagnostic quality images.

## 2024-01-17 IMAGING — MR MRI CERVICAL SPINE WITHOUT CONTRAST
7 of 12 series · 11 of 48 positions shown · IV contrast (gadolinium)
Comparison: CT thoracic spine January 17, 2024. Thoracic spine x-ray October 07, 2023.

________________________________________________________________________________________________ 
MRI CERVICAL SPINE WITHOUT CONTRAST, 01/17/2024 [DATE]: 
CLINICAL INDICATION: Spinal stenosis, lumbar region. Spinal stenosis, lumbar 
region with neurogenic claudication. Spinal stenosis, lumbar region without 
neurogenic Gan , pain. History of breast cancer.
TECHNIQUE: Multiplanar, multiecho position MR images of the cervical spine were 
performed without intravenous gadolinium enhancement. Patient was scanned on a 
1.5T magnet.

[Series 101: survey · axial · 10.0mm · 1.25mm/px · z∈[-204,+7]mm · 2 of 10 slices shown]
[im 1/10]
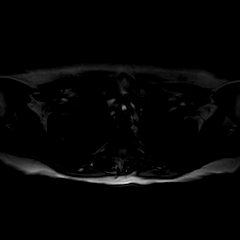
[im 10/10]
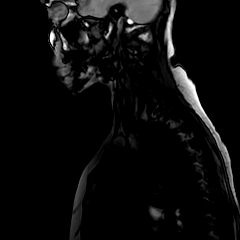

[Series 201: t2w_cor-surv · coronal · 5.0mm · 0.69mm/px · 1 of 7 slices shown]
[im 1/7]
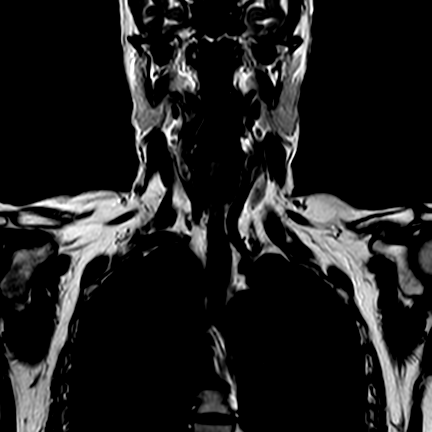

[Series 301: T1 · sagittal · 3.0mm · 0.39mm/px · 1 of 15 slices shown]
[im 1/15]
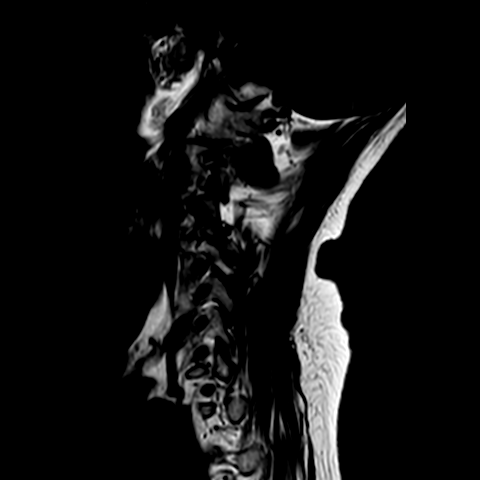

[Series 402: (id)_mdixon_tse · sagittal · 3.0mm · 0.34mm/px · 1 of 15 slices shown]
[im 1/15]
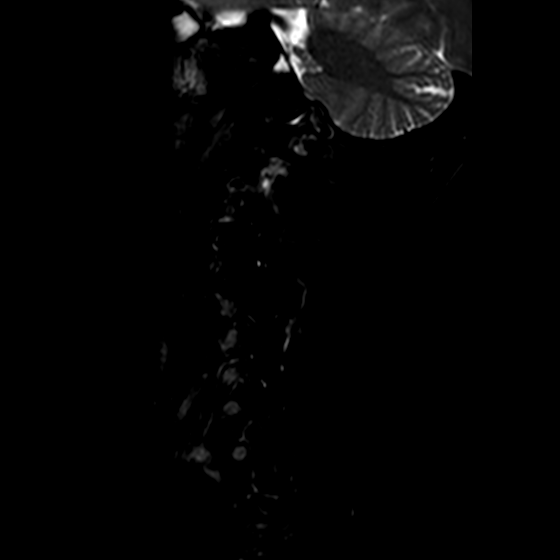

[Series 403: st2w_mdixon_tse · sagittal · 3.0mm · 0.34mm/px · 1 of 15 slices shown]
[im 1/15]
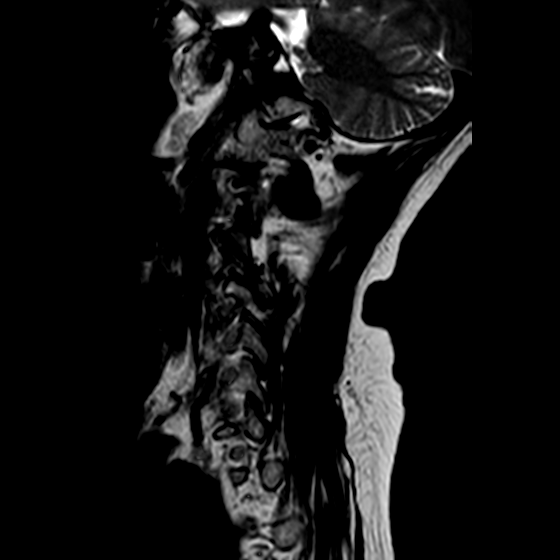

[Series 504: csp oblq left · oblique · 1.0mm · 0.15mm/px · 3 of 34 slices shown]
[im 1/34]
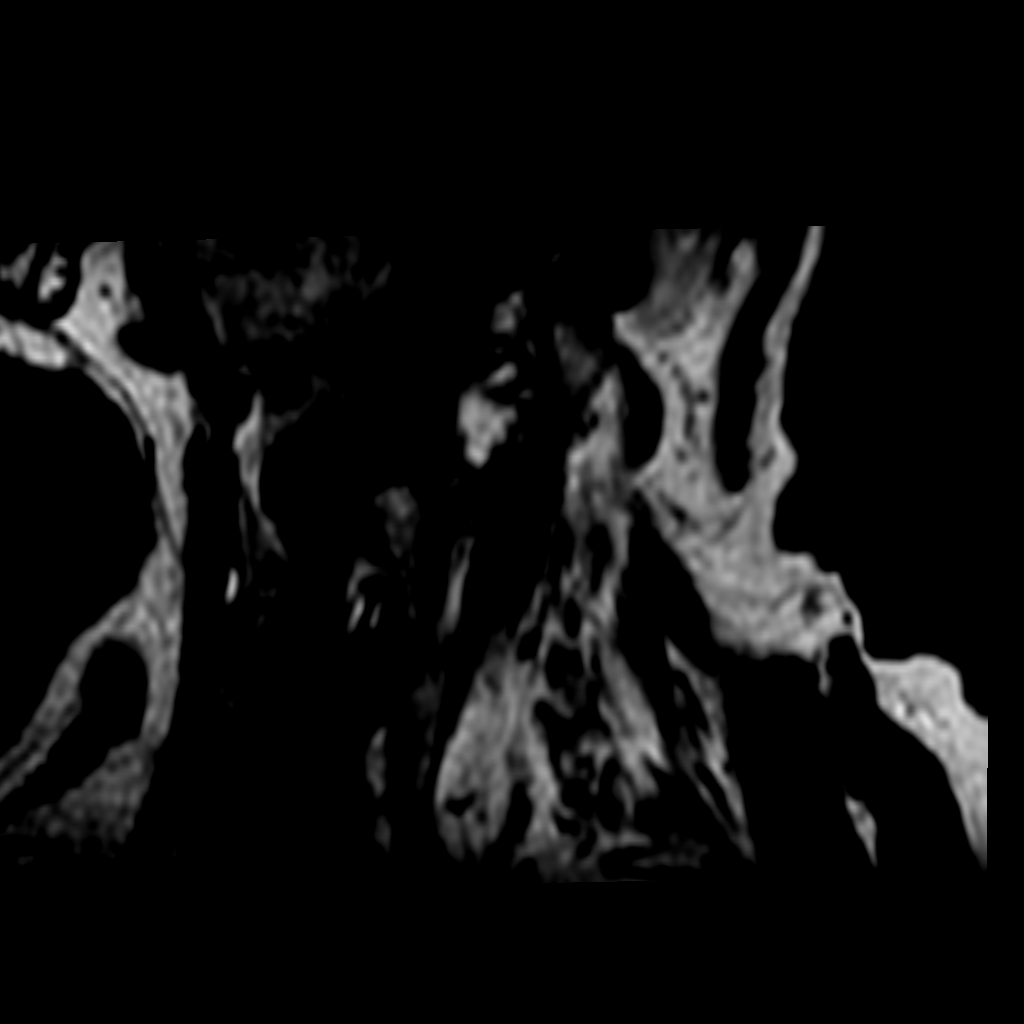
[im 17/34]
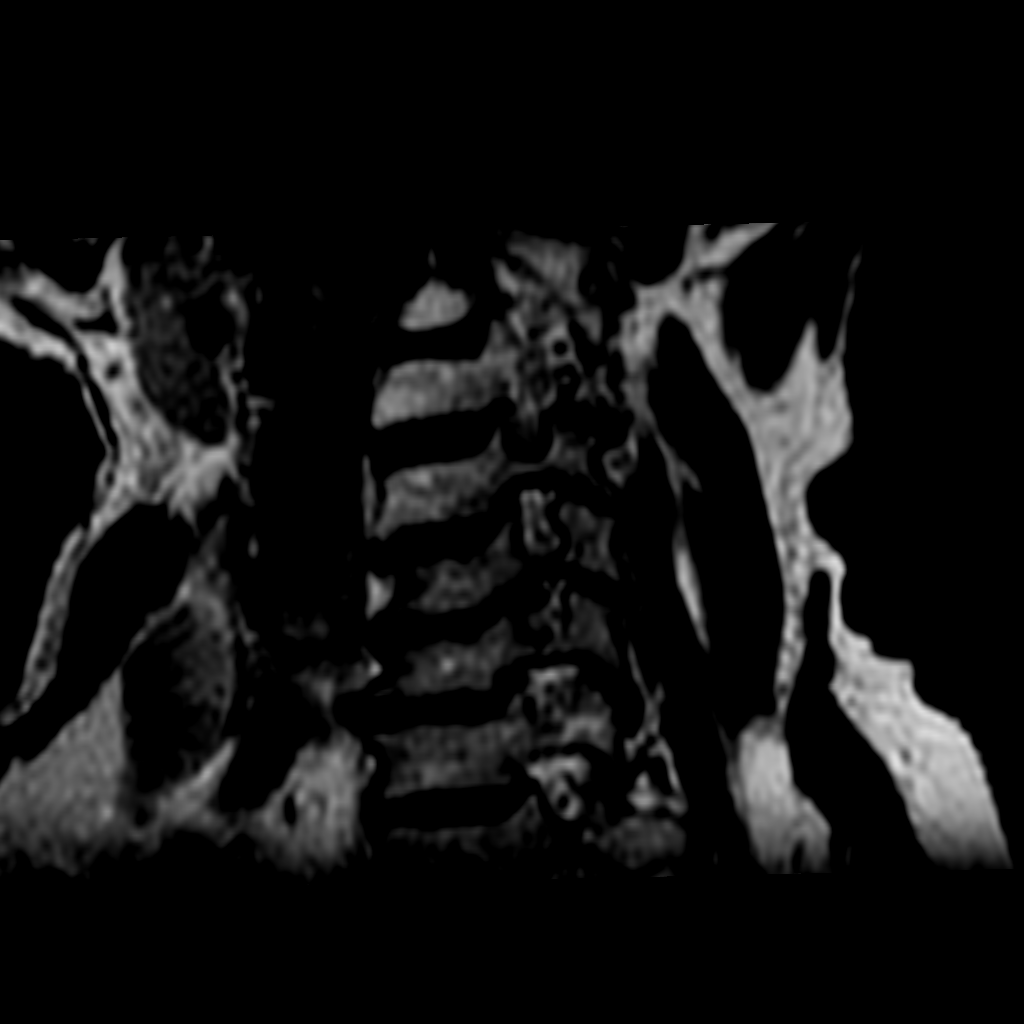
[im 34/34]
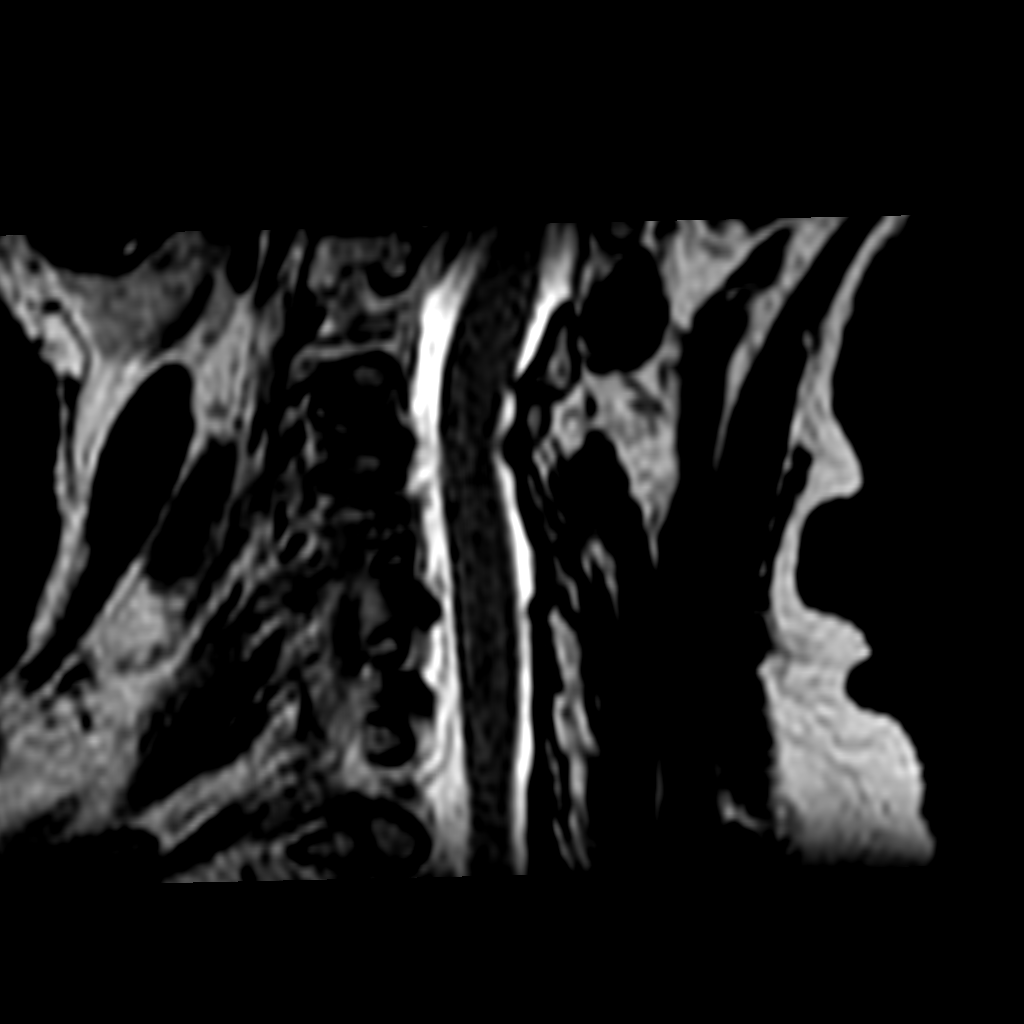

[Series 601: T2 · axial · 3.0mm · 0.29mm/px · z∈[-164,-102]mm · 2 of 18 slices shown]
[im 1/18]
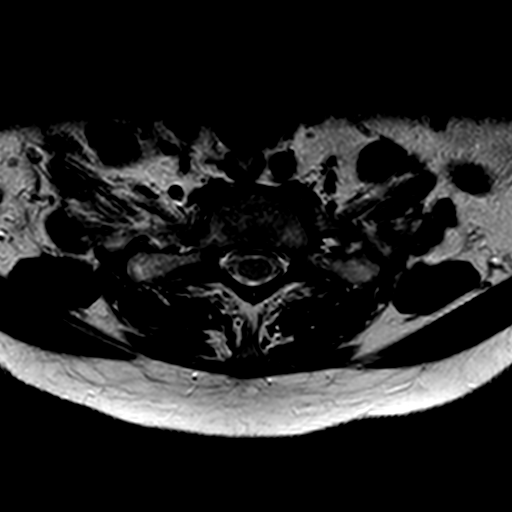
[im 18/18]
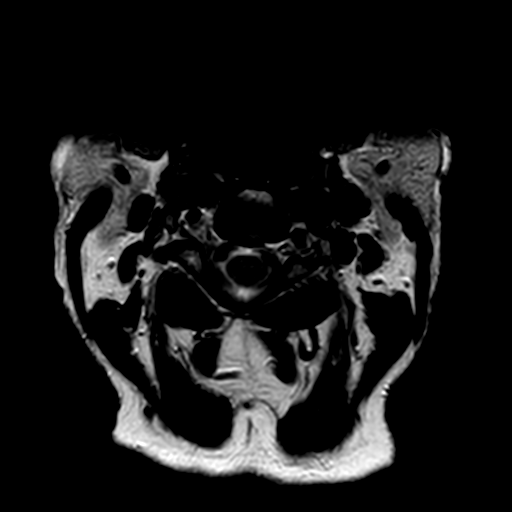

[11 of 48 positions shown; findings below may reference images not displayed]

FINDINGS: -------------------------------------------------------------------------------- 
----------------- 
GENERAL: 
ALIGNMENT: Normal coronal alignment. Loss of normal lumbar lordosis with 
straightening of lumbar alignment and grade 1 anterolisthesis C4 on C5 and grade 
1 retrolisthesis C5 on C6. 
VERTEBRAL BODY HEIGHT: Normal  
MARROW SIGNAL: No focal suspect signal abnormality. 
CORD SIGNAL: Normal.  
ADDITIONAL FINDINGS: Right sphenoid locule membrane thickening. 
-------------------------------------------------------------------------------- 
---------------- 
SEGMENTAL: 
CRANIOCERVICAL JUNCTION: No significant stenosis. 
C2-C3: Ligamentum flavum hypertrophy. Normal disc height. Borderline canal 
stenosis. Foramina patent. Normal facets. 
C3-C4: Normal disc height. Ligamentum flavum hypertrophy with borderline canal 
stenosis and very slight ventral cord flattening. Left uncinate spurring. Right 
facet arthropathy. Mild right foraminal narrowing. Left foramen patent. 
C4-C5: Slight loss of disc height. Disc uncovering. Mild canal stenosis with 
ventral cord flattening. Right facet arthropathy. Right-sided uncinate spurring 
with moderate right foraminal narrowing. Mild left foraminal narrowing. 
C5-C6: Loss of disc height. Disc osteophyte complex with mild canal stenosis. 
Uncinate spurring. Normal facets. Mild to moderate right and moderate left 
foraminal narrowing. 
C6-C7: Loss of disc height. Disc osteophyte complex with mild canal stenosis and 
ventral cord flattening with bilateral uncinate spurring. Normal facets. Mild 
right foraminal narrowing. Left foramen patent. 
C7-T1: Normal disc height. Minimal disc osteophyte complex. Mild uncinate 
spurring although the foramina are patent. Normal facets. 
-------------------------------------------------------------------------------- 
---------------
IMPRESSION: Multilevel cervical spondylosis. No significant canal stenosis although there is 
ventral cord flattening C3-C4, C4-C5, and C6-C7. 
Multilevel foraminal narrowing as detailed above.

## 2024-01-18 IMAGING — MR MRI LUMBAR SPINE WITHOUT CONTRAST
7 of 9 series · 15 of 48 positions shown · IV contrast (gadolinium)
Comparison: MR thoracic spine [place] 11/04/2024. CT lumbar spine January 17, 2024. MRI lumbar spine November 12, 2022.

________________________________________________________________________________________________ 
MRI LUMBAR SPINE WITHOUT CONTRAST, 01/18/2024 [DATE]: 
CLINICAL INDICATION: Spinal stenosis, lumbar region. Spinal stenosis, lumbar 
region with neurogenic claudication. Spinal stenosis, lumbar region without 
neurogenic Em Kassem , persistent right flank and low back pain. History of breast 
cancer. Previous ablations.
TECHNIQUE: Multiplanar, multiecho position MR images of the lumbar spine were 
performed without intravenous gadolinium enhancement. Patient was scanned on a 
1.5T magnet

[Series 801: survey · axial · 10.0mm · 1.25mm/px · z∈[-512,-236]mm · 2 of 10 slices shown]
[im 1/10]
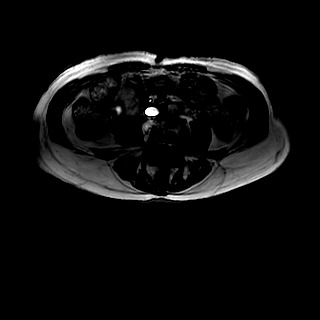
[im 10/10]
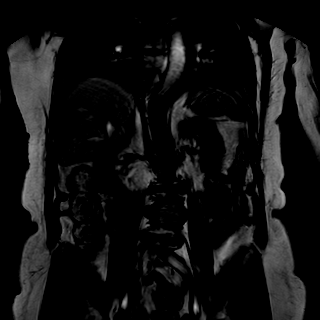

[Series 901: t2w_cor-surv · coronal · 6.0mm · 0.62mm/px · 2 of 10 slices shown]
[im 1/10]
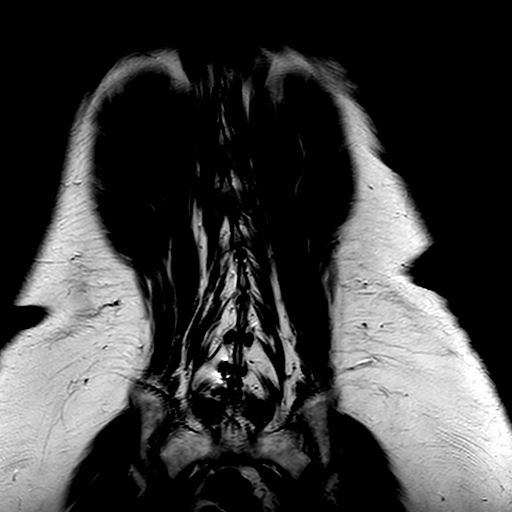
[im 10/10]
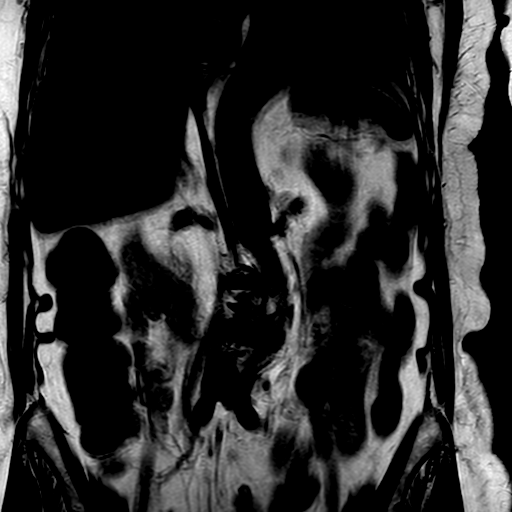

[Series 1001: t1_tse_sag · sagittal · 4.0mm · 0.33mm/px · 2 of 21 slices shown]
[im 1/21]
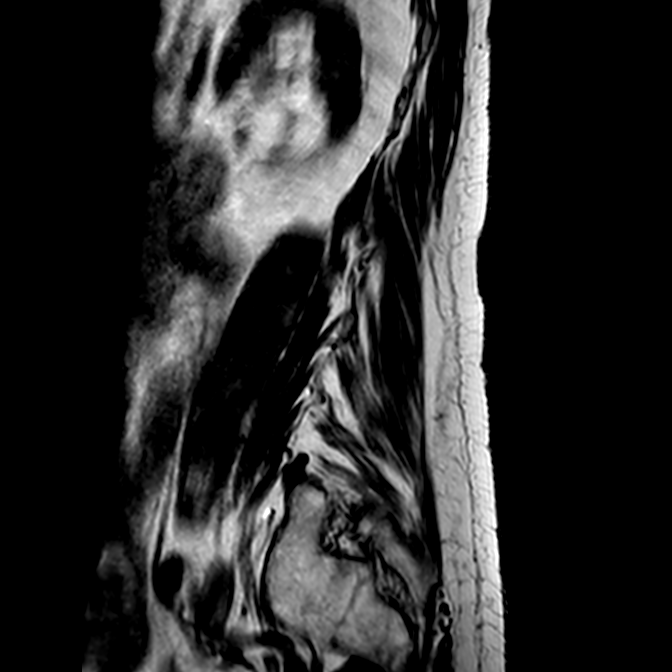
[im 21/21]
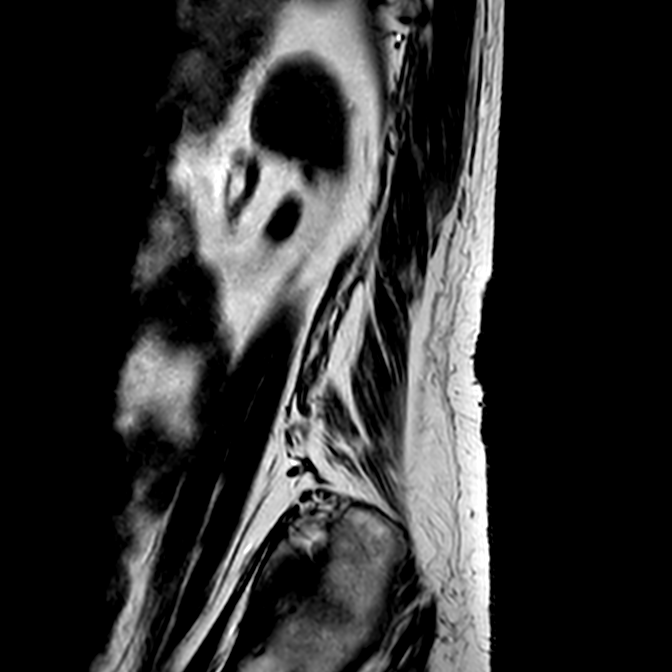

[Series 1102: (id)_mdixon_tse · sagittal · 4.0mm · 0.53mm/px · 2 of 21 slices shown]
[im 1/21]
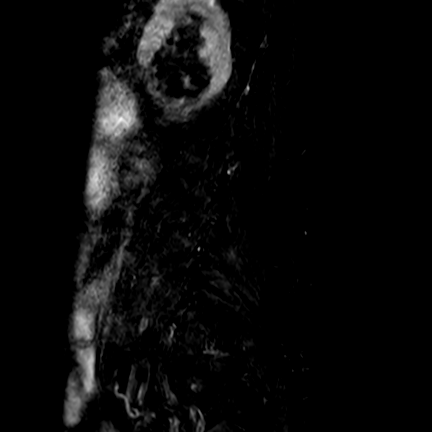
[im 21/21]
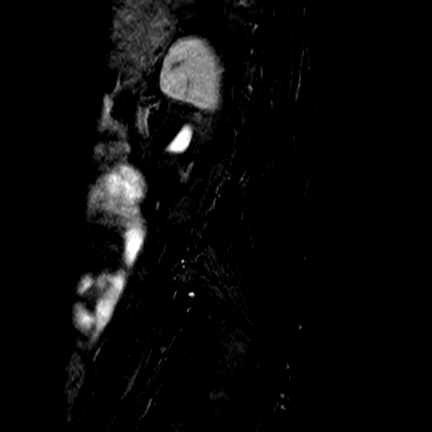

[Series 1103: st2w_mdixon_tse · sagittal · 4.0mm · 0.53mm/px · 2 of 21 slices shown]
[im 1/21]
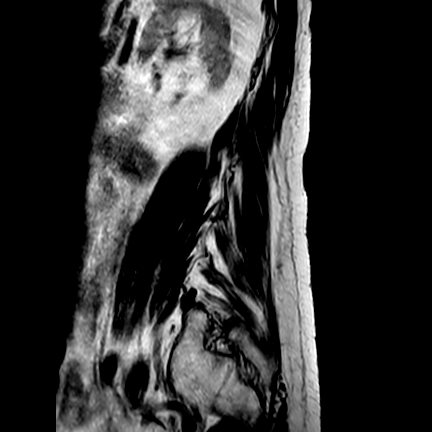
[im 21/21]
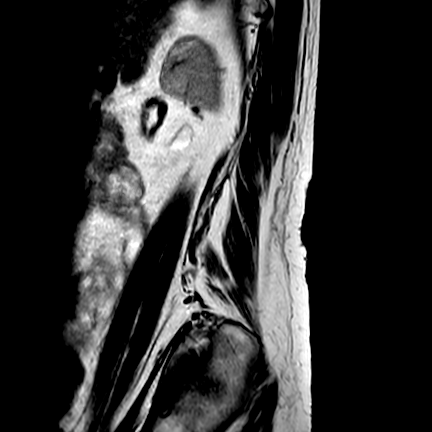

[Series 1202: (id) view_ax mpr · axial · 1.0mm · 0.25mm/px · 1 of 157 slices shown]
[im 10/157]
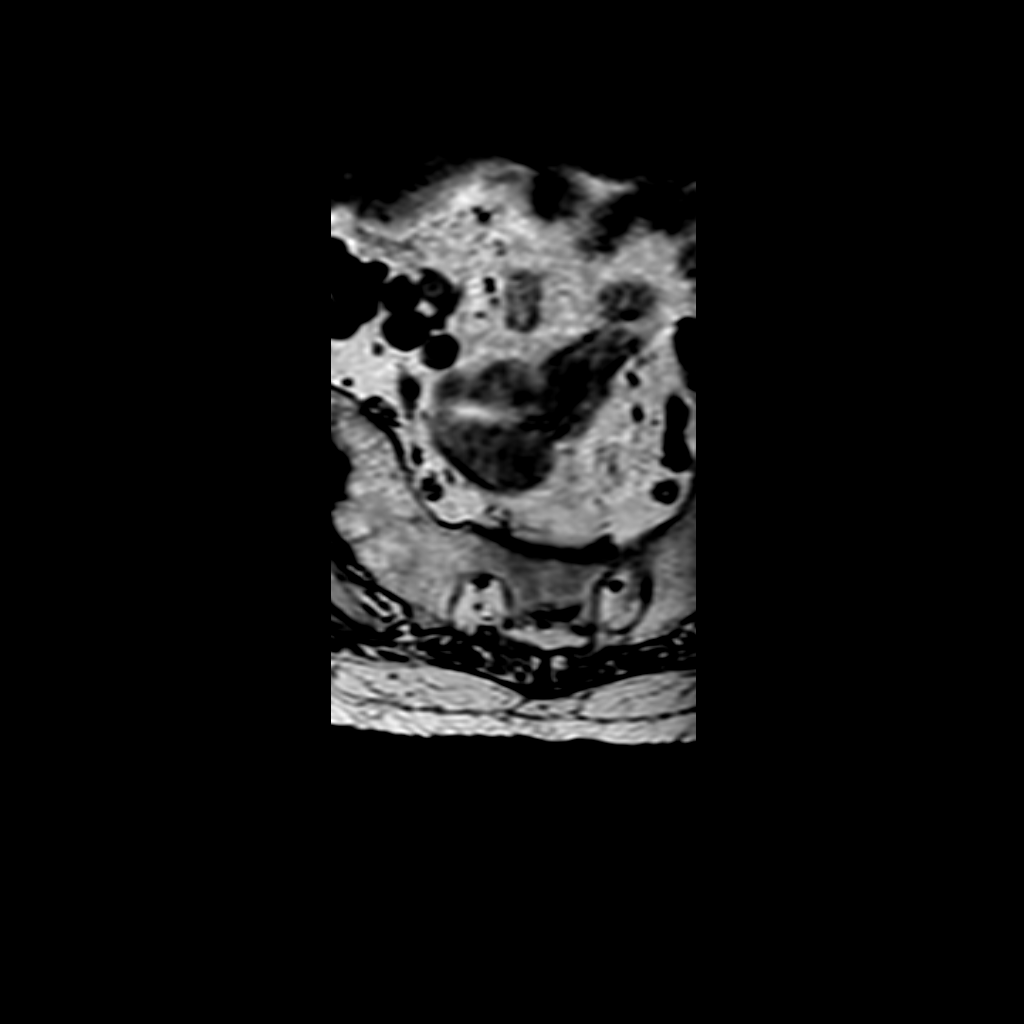

[Series 1401: T1 · axial · 4.0mm · 0.39mm/px · z∈[-574,-408]mm · 4 of 38 slices shown]
[im 1/38]
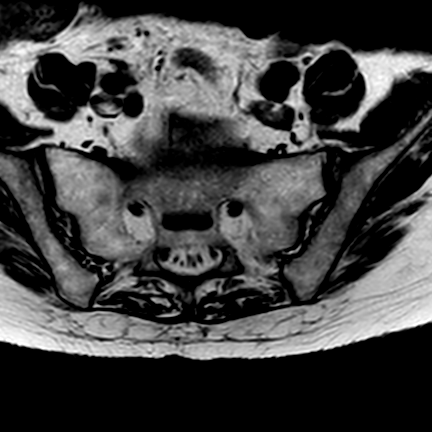
[im 13/38]
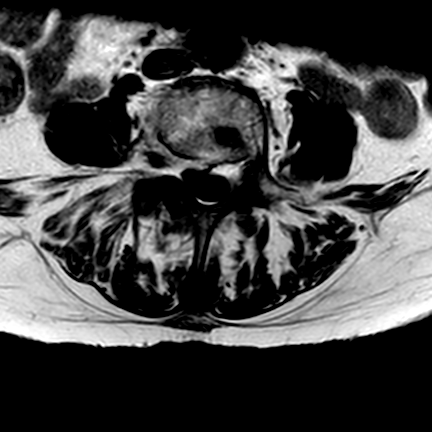
[im 25/38]
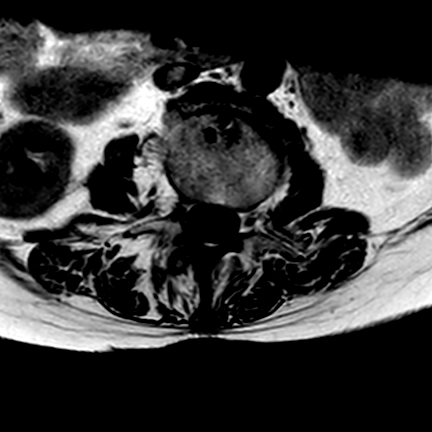
[im 38/38]
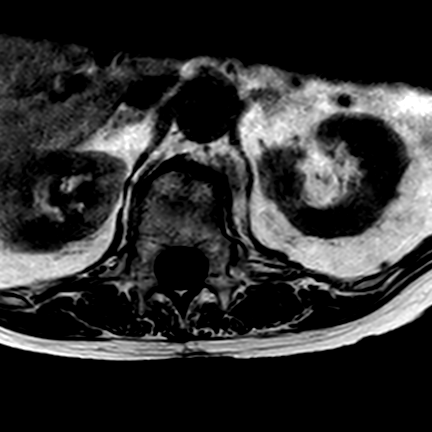

[15 of 48 positions shown; findings below may reference images not displayed]

FINDINGS: Right hemilaminectomy L5-S1. 
-------------------------------------------------------------------------------- 
------ 
GENERAL: 
Nomenclature is based on 5 lumbar type vertebral bodies.     
ALIGNMENT: Mild dextroconvex thoracic lumbar scoliosis. Mild to moderate 
levoconvex lumbar scoliosis. Grade 1 retrolisthesis T12 on L1, L1 on L2, L2 on 
L3 and L3 on L4. 
VERTEBRAL BODY HEIGHT: Mild chronic appearing anterior wedging of T12 is stable. 
MARROW SIGNAL: There are post ablation changes L4, L5, and S1. There is a 
similar, centrally located lesion in the posterior aspect of the L3 vertebral 
body which may reflect a post ablation change, although is smaller than the 
others sites of ablation change. 
CORD SIGNAL: Normal distal spinal cord and cauda equina. Conus medullaris 
terminates at L1. 
ADDITIONAL FINDINGS: Hepatic cyst.. 
Modic I-II: All levels except L5-S1 
Ligamentum Flavum > 2.5 mm: All except the laminectomy site 
-------------------------------------------------------------------------------- 
------ 
SEGMENTAL: 
T12-L1: Loss of disc height and signal with degenerative endplate irregularity. 
Minimal annular bulge. Minimal canal stenosis. Foramina patent. Normal facets. 
Stable. 
L1-L2: Loss of disc height and signal with Schmorls nodes. Mild annular bulge. 
Mild canal stenosis. Mild narrowing right lateral recess. Facet arthropathy with 
bilateral facet joint effusions. Left foramen patent. Mild right foraminal 
narrowing. Stable. 
L2-L3: Loss of disc height anteriorly. Loss of disc signal. Degenerative 
endplate irregularity. Schmorls node superior endplate L3. Annular bulge with 
mild canal stenosis and mild narrowing right lateral recess. Small bilateral 
facet joint effusions. Borderline right foraminal narrowing. Left foramen 
patent. Stable. 
L3-L4: Loss of disc height and signal. Annular bulge with mild canal stenosis 
and mild narrowing of the lateral recesses bilaterally with facet arthropathy. 
Left foramen borderline narrowed. Right foramen moderately narrowed. Stable. 
L4-L5: Loss of disc height and signal with degenerative endplate irregularity. 
Annular bulge creates moderate canal stenosis with moderate lateral recess 
narrowing bilaterally. Facet arthropathy. Mild right and moderate left foraminal 
narrowing. Stable. 
L5-S1: Loss of disc signal. Annular bulge with mild narrowing right lateral 
recess. Borderline canal stenosis. Facet arthropathy. Right foramen patent. Mild 
to moderate left foraminal narrowing. Facet arthropathy with small bilateral 
facet joint effusions. The canal demonstrates better patency in the interval and 
the right lateral recess is more patent as well. Otherwise stable. 
-------------------------------------------------------------------------------- 
------
IMPRESSION: Post ablation changes as detailed above within L4, L5, and S1. 
Similar, although smaller lesion in the posterior aspect of the L3 vertebral 
body may also reflect post ablation change, if intervention has been performed 
at this level. Please correlate with previous history. In the absence of 
ablation at the L3 level, this lesion would be suspicious for other lesion 
including small metastatic focus and could consider correlation with bone scan. 
The lesion in L3 is new since the previous MRI study November 12, 2022. 
Right hemilaminectomy L5-S1 with improved patency of the canal and the right 
lateral recess region. 
Other stable appearing multilevel lumbar degenerative changes. Most significant 
canal stenosis is at L4-L5, moderate.

## 2024-01-18 IMAGING — MR MRI THORACIC SPINE WITHOUT CONTRAST
5 of 9 series · 10 of 48 positions shown · IV contrast (gadolinium)
Comparison: MRI lumbar spine January 18, 2024.

________________________________________________________________________________________________ 
MRI THORACIC SPINE WITHOUT CONTRAST, 01/18/2024 [DATE]: 
CLINICAL INDICATION: Spinal stenosis. Right flank and low back pain. History of 
breast cancer. Evaluate scoliosis.
TECHNIQUE: Multiplanar, multiecho position MR images of the thoracic spine were 
performed without intravenous gadolinium enhancement. Patient was scanned on a 
1.5 Tesla magnet.

[Series 201: t2_sag_count · sagittal · 4.0mm · 0.62mm/px · 2 of 22 slices shown]
[im 1/22]
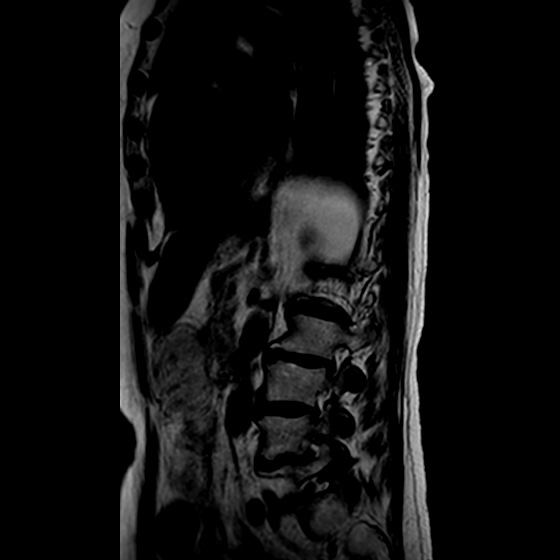
[im 22/22]
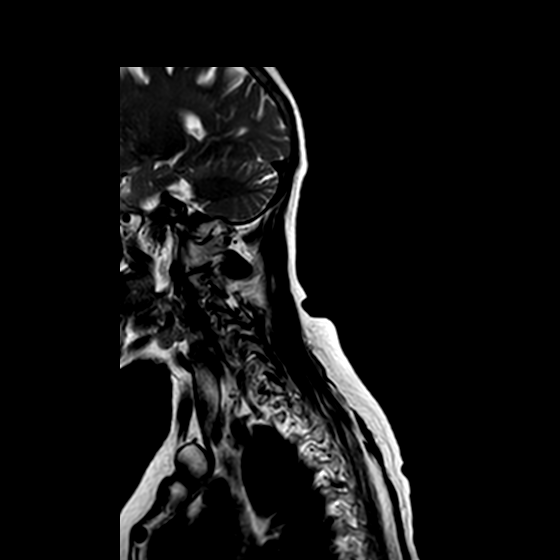

[Series 202: T2 · sagittal · 4.0mm · 0.62mm/px · 2 of 11 slices shown]
[im 1/11]
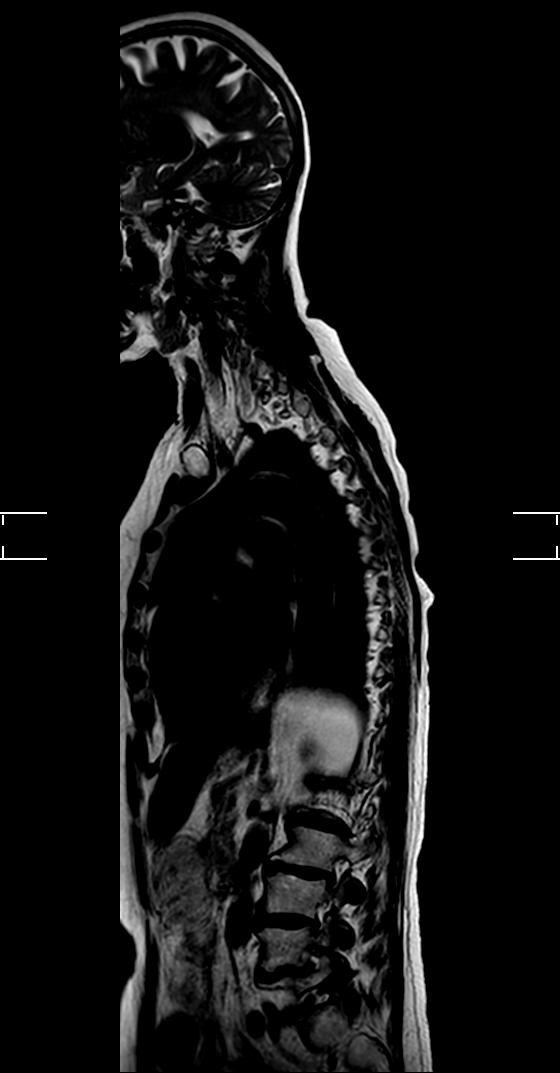
[im 11/11]
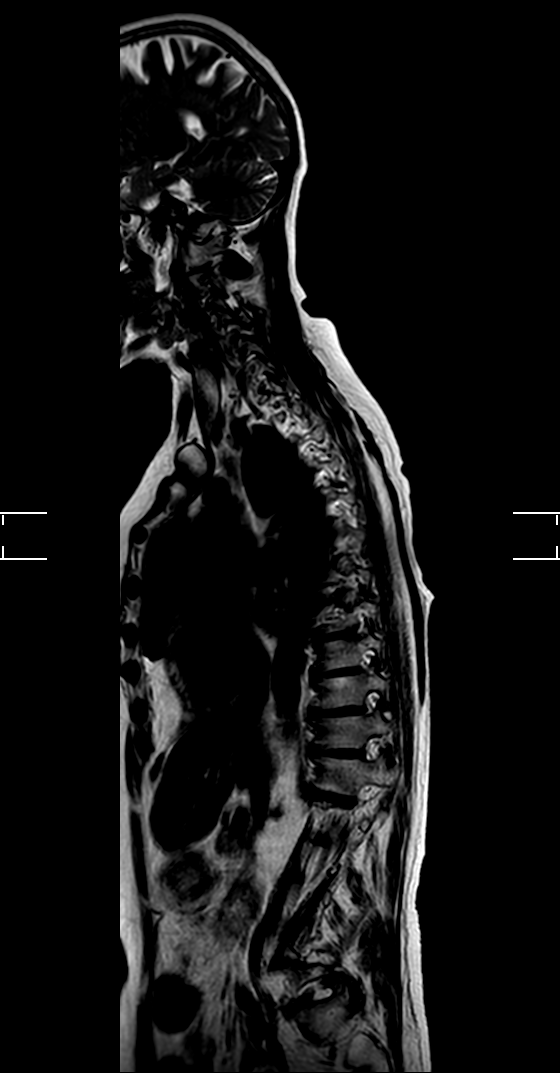

[Series 301: t2_cor_count · coronal · 4.0mm · 0.61mm/px · 2 of 15 slices shown]
[im 1/15]
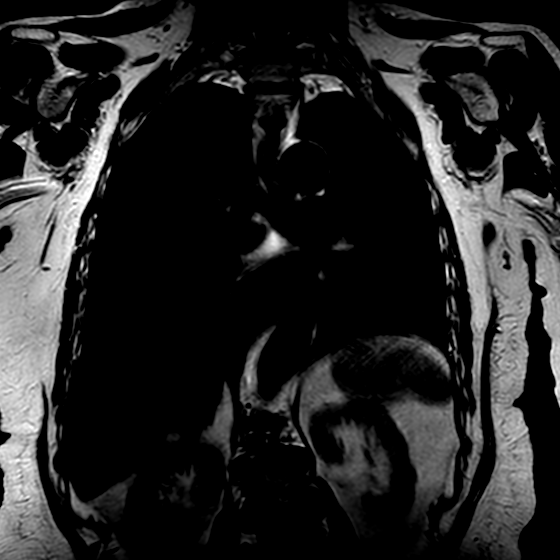
[im 15/15]
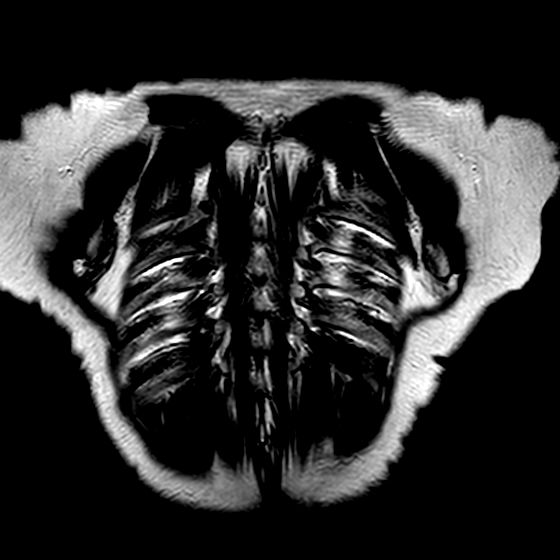

[Series 402: (id)_mdixon_tse · sagittal · 4.0mm · 0.36mm/px · 3 of 17 slices shown]
[im 1/17]
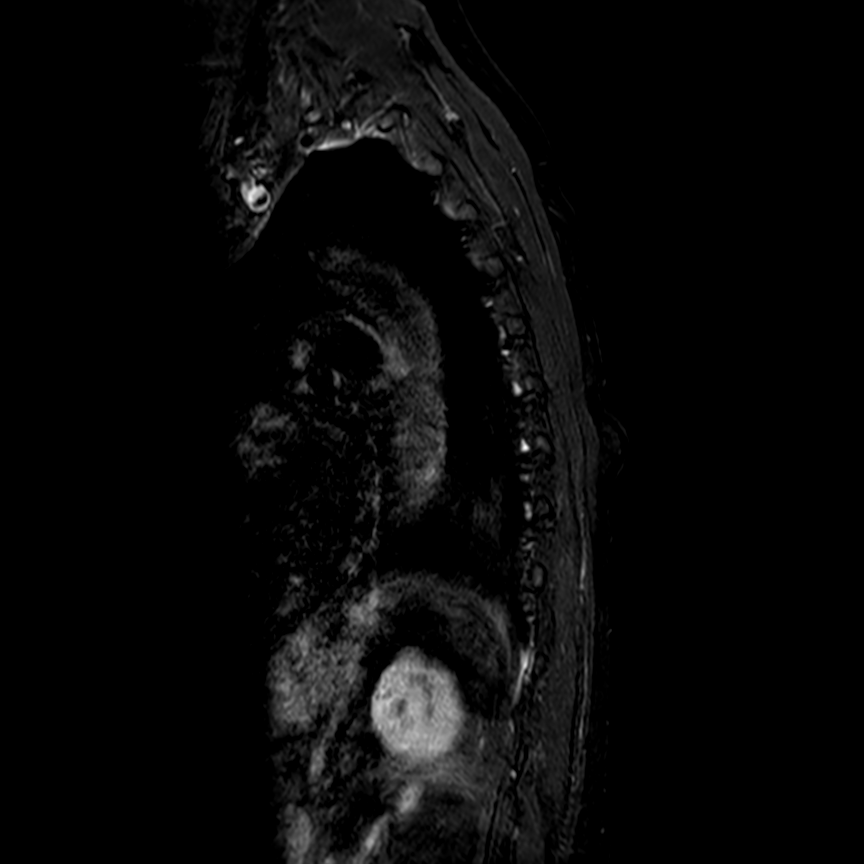
[im 9/17]
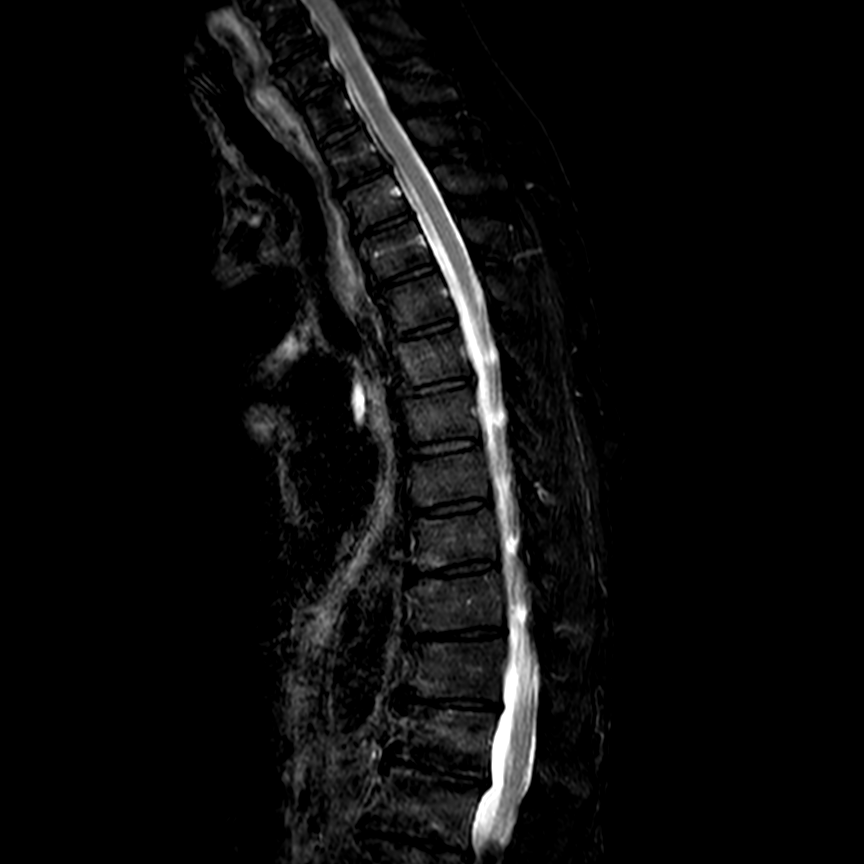
[im 17/17]
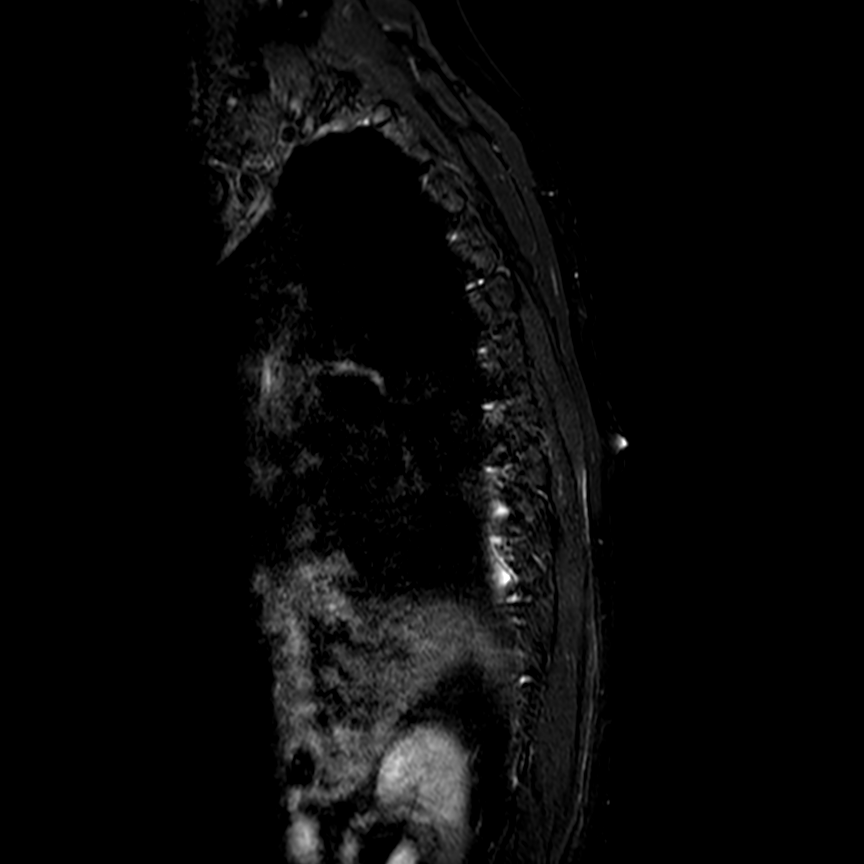

[Series 403: st2w_mdixon_tse · sagittal · 4.0mm · 0.36mm/px · 1 of 17 slices shown]
[im 1/17]
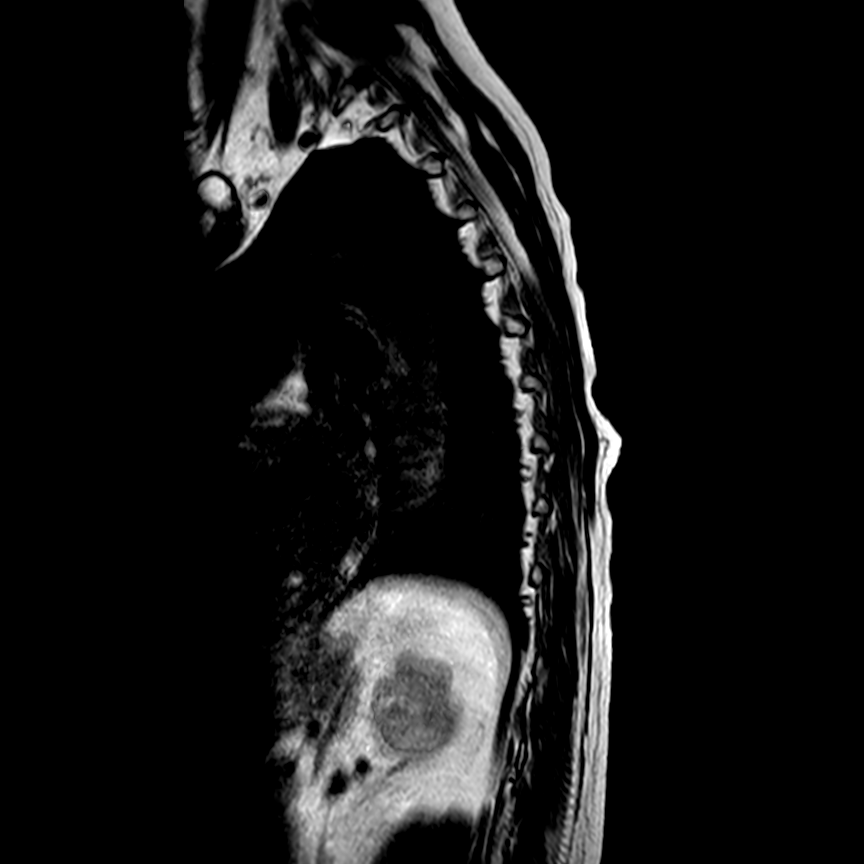

[10 of 48 positions shown; findings below may reference images not displayed]

MRI cervical spine January 17, 2024. CT thoracic and lumbar spine January 17, 2024. Thoracic and lumbar x-ray
FINDINGS: Sagittal localizer demonstrates 12 thoracic and 5 lumbar type vertebral bodies. 
Cervical and lumbar spondylosis noted. 
-------------------------------------------------------------------------------- 
------ 
GENERAL: 
ALIGNMENT: Mild dextroconvex thoracic lumbar scoliosis. Anatomic sagittal 
alignment. 
VERTEBRAL BODY HEIGHT: Normal.  
MARROW SIGNAL: No focal suspect signal abnormality. 
CORD SIGNAL: Normal. 
ADDITIONAL FINDINGS: Hepatic cyst. 
-------------------------------------------------------------------------------- 
------ 
RELEVANT SEGMENTAL (levels with severe stenosis or significant findings): 
Mild multilevel annular bulges are present, but none of which abut or distorts 
the ventral cord margin. 
There are prominent type II Modic endplate signal changes T12-L1. 
Additional scattered discogenic/degenerative changes are noted. 
-------------------------------------------------------------------------------- 
------
IMPRESSION: Mild thoracic degenerative and scoliotic changes as detailed above. No critical 
or significant canal or foraminal stenosis.
